# Patient Record
Sex: Male | Born: 1965 | Race: Black or African American | Hispanic: No | Smoking: Never smoker
Health system: Southern US, Community
[De-identification: ages and names within clinical notes are randomized; demographics above are authoritative.]

## PROBLEM LIST (undated history)

## (undated) DIAGNOSIS — I509 Heart failure, unspecified: Secondary | ICD-10-CM

## (undated) DIAGNOSIS — I10 Essential (primary) hypertension: Secondary | ICD-10-CM

## (undated) HISTORY — PX: MANDIBLE FRACTURE SURGERY: SHX706

---

## 2013-09-07 ENCOUNTER — Other Ambulatory Visit: Payer: Self-pay

## 2013-09-07 ENCOUNTER — Emergency Department (HOSPITAL_COMMUNITY): Payer: BC Managed Care – PPO

## 2013-09-07 ENCOUNTER — Encounter (HOSPITAL_COMMUNITY): Payer: Self-pay | Admitting: Emergency Medicine

## 2013-09-07 ENCOUNTER — Inpatient Hospital Stay (HOSPITAL_COMMUNITY)
Admission: EM | Admit: 2013-09-07 | Discharge: 2013-09-10 | DRG: 291 | Disposition: A | Discharge status: Home / Self Care | Payer: BC Managed Care – PPO | Attending: Internal Medicine | Admitting: Internal Medicine

## 2013-09-07 DIAGNOSIS — I959 Hypotension, unspecified: Secondary | ICD-10-CM | POA: Diagnosis present

## 2013-09-07 DIAGNOSIS — N289 Disorder of kidney and ureter, unspecified: Secondary | ICD-10-CM

## 2013-09-07 DIAGNOSIS — I5023 Acute on chronic systolic (congestive) heart failure: Principal | ICD-10-CM | POA: Diagnosis present

## 2013-09-07 DIAGNOSIS — J069 Acute upper respiratory infection, unspecified: Secondary | ICD-10-CM | POA: Diagnosis present

## 2013-09-07 DIAGNOSIS — I420 Dilated cardiomyopathy: Secondary | ICD-10-CM

## 2013-09-07 DIAGNOSIS — R0602 Shortness of breath: Secondary | ICD-10-CM

## 2013-09-07 DIAGNOSIS — I429 Cardiomyopathy, unspecified: Secondary | ICD-10-CM | POA: Diagnosis present

## 2013-09-07 DIAGNOSIS — I5021 Acute systolic (congestive) heart failure: Secondary | ICD-10-CM

## 2013-09-07 DIAGNOSIS — Z79899 Other long term (current) drug therapy: Secondary | ICD-10-CM

## 2013-09-07 DIAGNOSIS — I509 Heart failure, unspecified: Secondary | ICD-10-CM

## 2013-09-07 DIAGNOSIS — I5022 Chronic systolic (congestive) heart failure: Secondary | ICD-10-CM | POA: Diagnosis present

## 2013-09-07 DIAGNOSIS — E669 Obesity, unspecified: Secondary | ICD-10-CM | POA: Diagnosis present

## 2013-09-07 DIAGNOSIS — J189 Pneumonia, unspecified organism: Secondary | ICD-10-CM | POA: Diagnosis present

## 2013-09-07 DIAGNOSIS — N183 Chronic kidney disease, stage 3 unspecified: Secondary | ICD-10-CM | POA: Diagnosis present

## 2013-09-07 HISTORY — DX: Heart failure, unspecified: I50.9

## 2013-09-07 LAB — COMPREHENSIVE METABOLIC PANEL
AST: 34 U/L (ref 0–37)
Albumin: 3.6 g/dL (ref 3.5–5.2)
Alkaline Phosphatase: 72 U/L (ref 39–117)
Calcium: 9.2 mg/dL (ref 8.4–10.5)
Creatinine, Ser: 1.36 mg/dL — ABNORMAL HIGH (ref 0.50–1.35)
Glucose, Bld: 107 mg/dL — ABNORMAL HIGH (ref 70–99)
Total Protein: 6.6 g/dL (ref 6.0–8.3)

## 2013-09-07 LAB — CBC WITH DIFFERENTIAL/PLATELET
Basophils Absolute: 0 10*3/uL (ref 0.0–0.1)
Lymphs Abs: 1.5 10*3/uL (ref 0.7–4.0)
MCH: 28.3 pg (ref 26.0–34.0)
MCHC: 34.6 g/dL (ref 30.0–36.0)
MCV: 81.8 fL (ref 78.0–100.0)
Monocytes Absolute: 0.2 10*3/uL (ref 0.1–1.0)
Monocytes Relative: 5 % (ref 3–12)
Platelets: 287 10*3/uL (ref 150–400)
RDW: 13.3 % (ref 11.5–15.5)
WBC: 4.3 10*3/uL (ref 4.0–10.5)

## 2013-09-07 LAB — PRO B NATRIURETIC PEPTIDE: Pro B Natriuretic peptide (BNP): 3249 pg/mL — ABNORMAL HIGH (ref 0–125)

## 2013-09-07 LAB — MAGNESIUM: Magnesium: 1.9 mg/dL (ref 1.5–2.5)

## 2013-09-07 LAB — TSH: TSH: 0.74 u[IU]/mL (ref 0.350–4.500)

## 2013-09-07 MED ORDER — ENOXAPARIN SODIUM 40 MG/0.4ML ~~LOC~~ SOLN
40.0000 mg | SUBCUTANEOUS | Status: DC
  Administered 2013-09-07 – 2013-09-09 (×3): 40 mg via SUBCUTANEOUS
  Filled 2013-09-07 (×5): qty 0.4

## 2013-09-07 MED ORDER — FUROSEMIDE 10 MG/ML IJ SOLN
40.0000 mg | Freq: Two times a day (BID) | INTRAMUSCULAR | Status: DC
  Administered 2013-09-07 – 2013-09-10 (×6): 40 mg via INTRAVENOUS
  Filled 2013-09-07 (×9): qty 4

## 2013-09-07 MED ORDER — POTASSIUM CHLORIDE ER 10 MEQ PO TBCR
40.0000 meq | EXTENDED_RELEASE_TABLET | Freq: Two times a day (BID) | ORAL | Status: DC
  Administered 2013-09-07 – 2013-09-10 (×7): 40 meq via ORAL
  Filled 2013-09-07 (×8): qty 4

## 2013-09-07 MED ORDER — ACETAMINOPHEN 650 MG RE SUPP: 650.0000 mg | RECTAL | Status: DC | PRN

## 2013-09-07 MED ORDER — FUROSEMIDE 10 MG/ML IJ SOLN
40.0000 mg | Freq: Once | INTRAMUSCULAR | Status: AC
  Administered 2013-09-07: 40 mg via INTRAVENOUS
  Filled 2013-09-07: qty 4

## 2013-09-07 MED ORDER — IBUPROFEN 400 MG PO TABS
400.0000 mg | ORAL_TABLET | ORAL | Status: DC | PRN
  Administered 2013-09-08 – 2013-09-09 (×2): 400 mg via ORAL
  Filled 2013-09-07 (×2): qty 1

## 2013-09-07 MED ORDER — ACETAMINOPHEN 325 MG PO TABS: 650.0000 mg | ORAL_TABLET | ORAL | Status: DC | PRN

## 2013-09-07 MED ORDER — ACETAMINOPHEN 325 MG PO TABS
650.0000 mg | ORAL_TABLET | Freq: Once | ORAL | Status: AC
  Administered 2013-09-07: 650 mg via ORAL
  Filled 2013-09-07: qty 2

## 2013-09-07 MED ORDER — MAGNESIUM HYDROXIDE 400 MG/5ML PO SUSP: 30.0000 mL | Freq: Every day | ORAL | Status: DC | PRN

## 2013-09-07 MED ORDER — DIAZEPAM 5 MG PO TABS
5.0000 mg | ORAL_TABLET | Freq: Four times a day (QID) | ORAL | Status: DC | PRN
  Administered 2013-09-07: 5 mg via ORAL
  Filled 2013-09-07: qty 1

## 2013-09-07 MED ORDER — POTASSIUM CHLORIDE ER 10 MEQ PO TBCR: 10.0000 meq | EXTENDED_RELEASE_TABLET | Freq: Two times a day (BID) | ORAL | Status: DC

## 2013-09-07 NOTE — ED Notes (Signed)
The pt has had difficulty breathing for the past 2 weeks  Getting worse tonight.  He woke up just pta here and could not breathe

## 2013-09-07 NOTE — ED Provider Notes (Signed)
CSN: [id number]     Arrival date & time 09/07/13  8657 History   First MD Initiated Contact with Patient 09/07/13 0448     Chief Complaint  Patient presents with  . Shortness of Breath   (Consider location/radiation/quality/duration/timing/severity/associated sxs/prior Treatment) HPI Comments: 47 yo AA male with new onset SOB and bilat LE edema.  Pt doesn't see a doctor and has no PMH.    No tobacco, EtOH, or IDU.    Normal PO intake, void, and stool.      Patient is a 47 y.o. male presenting with shortness of breath. The history is provided by the patient.  Shortness of Breath Severity:  Mild Onset quality:  Gradual Duration:  2 weeks Timing:  Constant Progression:  Unchanged Chronicity:  Recurrent Context: activity and URI   Context: not animal exposure, not emotional upset, not fumes, not known allergens, not occupational exposure, not pollens, not smoke exposure, not strong odors and not weather changes   Context comment:  SOB with short distance walking Relieved by:  Nothing Worsened by:  Nothing tried Ineffective treatments:  None tried Associated symptoms: no chest pain, no claudication, no cough, no diaphoresis and no ear pain   Associated symptoms comment:  Leg edema Risk factors: obesity   Risk factors: no recent alcohol use, no family hx of DVT, no hx of cancer, no hx of PE/DVT, no oral contraceptive use, no prolonged immobilization, no recent surgery and no tobacco use     History reviewed. No pertinent past medical history. History reviewed. No pertinent past surgical history. No family history on file. History  Substance Use Topics  . Smoking status: Never Smoker   . Smokeless tobacco: Not on file  . Alcohol Use: No    Review of Systems  Constitutional: Positive for activity change. Negative for chills, diaphoresis and appetite change.  HENT: Negative for ear pain.   Eyes: Negative.   Respiratory: Positive for shortness of breath. Negative for cough.    Cardiovascular: Positive for leg swelling. Negative for chest pain, palpitations and claudication.  Gastrointestinal: Negative.   Genitourinary: Negative.   Allergic/Immunologic: Negative.   Neurological: Negative.   Hematological: Negative.   Psychiatric/Behavioral: Negative.     Allergies  Review of patient's allergies indicates no known allergies.  Home Medications   Current Outpatient Rx  Name  Route  Sig  Dispense  Refill  . Aspirin-Acetaminophen-Caffeine (GOODY HEADACHE PO)   Oral   Take 1 Package by mouth every 6 (six) hours as needed (headache).          BP 146/81  Pulse 96  Temp(Src) 97.6 F (36.4 C)  Resp 20  Ht 6' (1.829 m)  Wt 282 lb (127.914 kg)  BMI 38.24 kg/m2  SpO2 97% Physical Exam  Nursing note and vitals reviewed. Constitutional: He is oriented to person, place, and time. He appears well-developed and well-nourished. No distress.  HENT:  Head: Normocephalic and atraumatic.  Eyes: Conjunctivae and EOM are normal. Pupils are equal, round, and reactive to light.  Neck: Normal range of motion. Neck supple.  Cardiovascular: Normal rate and regular rhythm.  Exam reveals no friction rub.   No murmur heard. Pulmonary/Chest: Effort normal and breath sounds normal. No respiratory distress. He has no wheezes. He has no rales. He exhibits no tenderness.  Abdominal: Soft. Bowel sounds are normal. He exhibits no distension and no mass. There is no tenderness. There is no rebound and no guarding.  Musculoskeletal: He exhibits edema. He exhibits no tenderness.  Neurological: He is alert and oriented to person, place, and time. He has normal reflexes.  Skin: Skin is warm and dry. He is not diaphoretic.    ED Course  Procedures (including critical care time) Labs Review Results for orders placed during the hospital encounter of 09/07/13  CBC WITH DIFFERENTIAL      Result Value Range   WBC 4.3  4.0 - 10.5 K/uL   RBC 5.34  4.22 - 5.81 MIL/uL   Hemoglobin 15.1   13.0 - 17.0 g/dL   HCT 16.1  09.6 - 04.5 %   MCV 81.8  78.0 - 100.0 fL   MCH 28.3  26.0 - 34.0 pg   MCHC 34.6  30.0 - 36.0 g/dL   RDW 40.9  81.1 - 91.4 %   Platelets 287  150 - 400 K/uL   Neutrophils Relative % 52  43 - 77 %   Lymphocytes Relative 34  12 - 46 %   Monocytes Relative 5  3 - 12 %   Eosinophils Relative 8 (*) 0 - 5 %   Basophils Relative 1  0 - 1 %   Neutro Abs 2.3  1.7 - 7.7 K/uL   Lymphs Abs 1.5  0.7 - 4.0 K/uL   Monocytes Absolute 0.2  0.1 - 1.0 K/uL   Eosinophils Absolute 0.3  0.0 - 0.7 K/uL   Basophils Absolute 0.0  0.0 - 0.1 K/uL   Smear Review MORPHOLOGY UNREMARKABLE    COMPREHENSIVE METABOLIC PANEL      Result Value Range   Sodium 142  135 - 145 mEq/L   Potassium 4.2  3.5 - 5.1 mEq/L   Chloride 107  96 - 112 mEq/L   CO2 25  19 - 32 mEq/L   Glucose, Bld 107 (*) 70 - 99 mg/dL   BUN 15  6 - 23 mg/dL   Creatinine, Ser 7.82 (*) 0.50 - 1.35 mg/dL   Calcium 9.2  8.4 - 95.6 mg/dL   Total Protein 6.6  6.0 - 8.3 g/dL   Albumin 3.6  3.5 - 5.2 g/dL   AST 34  0 - 37 U/L   ALT 36  0 - 53 U/L   Alkaline Phosphatase 72  39 - 117 U/L   Total Bilirubin 0.5  0.3 - 1.2 mg/dL   GFR calc non Af Amer 60 (*) >90 mL/min   GFR calc Af Amer 69 (*) >90 mL/min  TROPONIN I      Result Value Range   Troponin I <0.30  <0.30 ng/mL  PRO B NATRIURETIC PEPTIDE      Result Value Range   Pro B Natriuretic peptide (BNP) 3249.0 (*) 0 - 125 pg/mL    Imaging Review Dg Chest 2 View  09/07/2013   CLINICAL DATA:  Shortness of breath for 10 days.  EXAM: CHEST  2 VIEW  COMPARISON:  None.  FINDINGS: The lungs are well-aerated. Patchy left-sided airspace opacity raises concern for pneumonia. Mild asymmetric pulmonary edema is considered less likely, though there is underlying vascular congestion. No pleural effusion or pneumothorax is seen.  The heart is enlarged.  No acute osseous abnormalities are seen.  IMPRESSION: Patchy left-sided airspace opacity raises concern for pneumonia. There is  underlying vascular congestion and cardiomegaly, and mild asymmetric pulmonary edema might have a similar appearance.   Electronically Signed   By: Roanna Raider M.D.   On: 09/07/2013 05:29     Date: 09/07/2013  Rate: 91  Rhythm: normal sinus rhythm  QRS  Axis: left  Intervals: normal and QT prolonged  ST/T Wave abnormalities: normal  Conduction Disutrbances:none  Narrative Interpretation:   Old EKG Reviewed: none available    MDM   1. CHF (congestive heart failure)   2. SOB (shortness of breath)    47 year old African American male presents emergency department with chief complaint of shortness of breath. Patient states his symptoms have been increasing over the past 2-3 weeks. He reports dyspnea on exertion which is new. He also has bilateral lower extremity edema. Patient denies chest pain.  EKG without signs of ischemia however patient does have a left axis deviation and frequent PVCs noted. Chest x-ray interpreted as possible pneumonia versus asymmetric pulmonary edema. Clinically the patient does not present as a pneumonia. He does not have a productive cough, fever, and his white cell count is normal. I will not treat with antibiotics at this time.  Case discussed with hospitalist plan for admission for new onset CHF. Will give dose of Lasix in the emergency department.  The patient appears reasonably stabilized for admission considering the current resources, flow, and capabilities available in the ED at this time, and I doubt any other [facility] requiring further screening and/or treatment in the ED prior to admission.    Darlys Gales, MD 09/07/13 [phone]

## 2013-09-07 NOTE — H&P (Signed)
[facility] History and Physical  Noah Floyd EXB:[id number] DOB: 1965/10/22 DOA: 09/07/2013  Referring physician:  PCP: No PCP Per Patient   Chief Complaint: shortness of breath  HPI: Noah Floyd is a 47 y.o. male with no PMH presents with several weeks of worsening DOE. Leg edema.  Weight gain, PND.  Had a cold prior to onset, but no cough, F/C currently.  No h/o same. In ed, had CXR showing asymmetric pulmonary edema and pBNP >3000.  Received lasix, and slightly less SOB.   Review of Systems:  Systems reviewed. As above. Otherwise negative  History reviewed. No pertinent past medical history. Past Surgical History  Procedure Laterality Date  . Mandible fracture surgery     Social History: married. No h/o smoking. Previous EtOH, none for > 10 years. Denies EtOH. Married  No Known Allergies  Family History  Problem Relation Age of Onset  . Heart disease Mother   . Diabetes Mother      Prior to Admission medications   Medication Sig Start Date End Date Taking? Authorizing Provider  Aspirin-Acetaminophen-Caffeine (GOODY HEADACHE PO) Take 1 Package by mouth every 6 (six) hours as needed (headache).   Yes Historical Provider, MD   Physical Exam: Filed Vitals:   09/07/13 0723  BP: 143/82  Pulse: 93  Temp:   Resp: 18    BP 143/82  Pulse 93  Temp(Src) 97.6 F (36.4 C)  Resp 18  Ht 6' (1.829 m)  Wt 127.914 kg (282 lb)  BMI 38.24 kg/m2  SpO2 97%  BP 143/90  Pulse 94  Temp(Src) 98.2 F (36.8 C) (Oral)  Resp 18  Ht 6' (1.829 m)  Wt 127.914 kg (282 lb)  BMI 38.24 kg/m2  SpO2 97%  General Appearance:    Alert, cooperative, no distress, appears stated age  Head:    Normocephalic, without obvious abnormality, atraumatic  Eyes:    PERRL, conjunctiva/corneas clear, EOM's intact, fundi    benign, both eyes          Nose:   Nares normal, septum midline, mucosa normal, no drainage   or sinus tenderness  Throat:   Lips, mucosa, and tongue normal; teeth and gums  normal  Neck:   Supple. JVD present. No thyromegaly, lymphadenopathy  Back:     Symmetric, no curvature, ROM normal, no CVA tenderness  Lungs:     Clear to auscultation bilaterally, respirations unlabored  Chest wall:    No tenderness or deformity  Heart:    Regular rate and rhythm, S1 and S2 normal, no murmur, rub   or gallop  Abdomen:     Soft, non-tender, bowel sounds active all four quadrants,    no masses, no organomegaly  Genitalia:  Deferred   Rectal:  Deferred  Extremities:   Extremities normal, atraumatic, no cyanosis 1+ edema  Pulses:   2+ and symmetric all extremities  Skin:   Skin color, texture, turgor normal, no rashes or lesions  Lymph nodes:   Cervical, supraclavicular, and axillary nodes normal  Neurologic:   CNII-XII intact. Normal strength, sensation and reflexes      throughout                    Psych: normal affect  Labs on Admission:  Basic Metabolic Panel:  Recent Labs Lab 09/07/13 0420  NA 142  K 4.2  CL 107  CO2 25  GLUCOSE 107*  BUN 15  CREATININE 1.36*  CALCIUM 9.2   Liver Function Tests:  Recent  Labs Lab 09/07/13 0420  AST 34  ALT 36  ALKPHOS 72  BILITOT 0.5  PROT 6.6  ALBUMIN 3.6   No results found for this basename: LIPASE, AMYLASE,  in the last 168 hours No results found for this basename: AMMONIA,  in the last 168 hours CBC:  Recent Labs Lab 09/07/13 0420  WBC 4.3  NEUTROABS 2.3  HGB 15.1  HCT 43.7  MCV 81.8  PLT 287   Cardiac Enzymes:  Recent Labs Lab 09/07/13 0420  TROPONINI <0.30    BNP (last 3 results)  Recent Labs  09/07/13 0420  PROBNP 3249.0*   CBG: No results found for this basename: GLUCAP,  in the last 168 hours  Radiological Exams on Admission: Dg Chest 2 View  09/07/2013   CLINICAL DATA:  Shortness of breath for 10 days.  EXAM: CHEST  2 VIEW  COMPARISON:  None.  FINDINGS: The lungs are well-aerated. Patchy left-sided airspace opacity raises concern for pneumonia. Mild asymmetric pulmonary  edema is considered less likely, though there is underlying vascular congestion. No pleural effusion or pneumothorax is seen.  The heart is enlarged.  No acute osseous abnormalities are seen.  IMPRESSION: Patchy left-sided airspace opacity raises concern for pneumonia. There is underlying vascular congestion and cardiomegaly, and mild asymmetric pulmonary edema might have a similar appearance.   Electronically Signed   By: Roanna Raider M.D.   On: 09/07/2013 05:29    EKG: ISinus rhythm with frequent Premature ventricular complexes Left axis deviation Prolonged QT Abnormal ECG  Assessment/Plan Principal Problem:   CHF (congestive heart failure): lasix. Echo. TSH. Low salt diet and teaching. tele Active Problems:   Obesity, unspecified   Renal insufficiency: baseline unknown. monitor   Code Status: *full Family Communication: family at bedside Disposition Plan: home  Time spent: 60 min  Emmarie Sannes L [facility] Pager [phone]

## 2013-09-07 NOTE — ED Notes (Signed)
The pt is c/o intermittent chest tightness

## 2013-09-07 NOTE — ED Notes (Signed)
Charge nurse from [facility] will call back for report.

## 2013-09-07 NOTE — ED Notes (Signed)
The pt feels like he has a lump in his throat that has been there since he had a cold 2 weeks ago

## 2013-09-07 NOTE — Progress Notes (Signed)
Pt continues to c/o of cramps. Initially in his abdomen, then r ight side, btn his thighs and legs. Latest at this time is onhis legs. Will continue to monitor and refer accordingly.

## 2013-09-08 ENCOUNTER — Encounter (HOSPITAL_COMMUNITY): Payer: Self-pay | Admitting: Cardiology

## 2013-09-08 DIAGNOSIS — I428 Other cardiomyopathies: Secondary | ICD-10-CM

## 2013-09-08 DIAGNOSIS — I517 Cardiomegaly: Secondary | ICD-10-CM

## 2013-09-08 DIAGNOSIS — E669 Obesity, unspecified: Secondary | ICD-10-CM

## 2013-09-08 DIAGNOSIS — R0602 Shortness of breath: Secondary | ICD-10-CM

## 2013-09-08 DIAGNOSIS — I5021 Acute systolic (congestive) heart failure: Secondary | ICD-10-CM

## 2013-09-08 DIAGNOSIS — N289 Disorder of kidney and ureter, unspecified: Secondary | ICD-10-CM

## 2013-09-08 LAB — BASIC METABOLIC PANEL
BUN: 14 mg/dL (ref 6–23)
CO2: 25 mEq/L (ref 19–32)
Calcium: 9.1 mg/dL (ref 8.4–10.5)
Creatinine, Ser: 1.25 mg/dL (ref 0.50–1.35)
GFR calc Af Amer: 77 mL/min — ABNORMAL LOW (ref >90)
GFR calc non Af Amer: 66 mL/min — ABNORMAL LOW (ref >90)

## 2013-09-08 MED ORDER — LISINOPRIL 5 MG PO TABS
5.0000 mg | ORAL_TABLET | Freq: Every day | ORAL | Status: DC
  Administered 2013-09-08: 5 mg via ORAL
  Filled 2013-09-08 (×2): qty 1

## 2013-09-08 MED ORDER — LEVOFLOXACIN 750 MG PO TABS
750.0000 mg | ORAL_TABLET | Freq: Every day | ORAL | Status: DC
  Administered 2013-09-08: 750 mg via ORAL
  Filled 2013-09-08 (×3): qty 1

## 2013-09-08 NOTE — Care Management Note (Signed)
    Page 1 of 1   09/10/2013     10:04:18 AM   CARE MANAGEMENT NOTE 09/10/2013  Patient:  Noah Floyd,Noah Floyd   Account Number:  [id number]  Date Initiated:  09/08/2013  Documentation initiated by:  GRAVES-BIGELOW,Annaliese Saez  Subjective/Objective Assessment:   Pt admitted with SOB.     Action/Plan:   CM will continue to monitor for disposition needs.   Anticipated DC Date:  09/10/2013   Anticipated DC Plan:  HOME/SELF CARE      DC Planning Services  CM consult  PCP issues      Choice offered to / List presented to:             Status of service:  Completed, signed off Medicare Important Message given?   (If response is "NO", the following Medicare IM given date fields will be blank) Date Medicare IM given:   Date Additional Medicare IM given:    Discharge Disposition:  HOME/SELF CARE  Per UR Regulation:  Reviewed for med. necessity/level of care/duration of stay  If discussed at Long Length of Stay Meetings, dates discussed:    Comments:  09-10-13 [address] [place] [phone] CM did provide pt with the [facility] #. Pt was advised to also contact the customer service # on his BCBS card to find a PCP that accepts his insurance in this area. No further needs from CM at this time.

## 2013-09-08 NOTE — Progress Notes (Signed)
UR Completed Makaylynn Bonillas Graves-Bigelow, RN,BSN [phone]  

## 2013-09-08 NOTE — Progress Notes (Signed)
  Echocardiogram 2D Echocardiogram has been performed.  Noah Floyd 09/08/2013, 12:44 PM

## 2013-09-08 NOTE — Plan of Care (Signed)
Problem: Food- and Nutrition-Related Knowledge Deficit (NB-1.1) Goal: Nutrition education Formal process to instruct or train a patient/client in a skill or to impart knowledge to help patients/clients voluntarily manage or modify food choices and eating behavior to maintain or improve health. Outcome: Completed/Met Date Met:  09/08/13 Nutrition Education Note  RD consulted for nutrition education regarding new onset CHF.  RD provided "Low Sodium Nutrition Therapy" handout from the Academy of [employer]etics. Reviewed patient's dietary recall. Provided examples on ways to decrease sodium intake in diet. Discouraged intake of processed foods and use of salt shaker. Encouraged fresh fruits and vegetables as well as whole grain sources of carbohydrates to maximize fiber intake.   RD discussed why it is important for patient to adhere to diet recommendations, and emphasized the role of fluids, foods to avoid, and importance of weighing self daily. Teach back method used.  Expect fair to good compliance.  Body mass index is 36.37 kg/(m^2). Pt meets criteria for obesity, class 2 based on current BMI.  Current diet order is low sodium, patient is consuming approximately 100% of meals at this time. Labs and medications reviewed. No further nutrition interventions warranted at this time. RD contact information provided. If additional nutrition issues arise, please re-consult RD.    Joaquin Courts, RD, LDN, CNSC Pager [phone] After Hours Pager [phone]

## 2013-09-08 NOTE — Consult Note (Addendum)
Admit date: 09/07/2013 Referring Physician  Dr. Carmell Austria Primary Physician No PCP Per Patient Primary Cardiologist  none Reason for Consultation  EF 20-25%, cardiomyopathy, new onset heart failure  HPI: 47 year old male who presented with dyspnea cough, increase in weight over the past 2-4 weeks after a presumed upper respiratory viral infection with a BNP of 3200 who just underwent echocardiogram on 09/08/13 which showed diffuse hypokinesis of the left ventricle with systolic function of 20-25% EF. No prior history of cardiomyopathy or heart disease. He also does not have a prior history of hypertension. TSH was normal. No alcohol use. Nonsmoker. Mother did have dilated heart he states. Troponin was normal. 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  He has responded to Lasix IV 40 mg twice a day with output of approximately 2 L. Weight on admission was 282, weight on 12/14 was 268. He is in normal rhythm. EKG on 09/07/13 showed sinus rhythm with ventricular bigeminy, left axis deviation, nonspecific ST-T wave changes, borderline prolonged QT.  Currently he does feel much improved after aggressive diuresis. He just started a new job and he is very worried about missing these days in the hospital.   PMH:  History reviewed. No pertinent past medical history.  PSH:   Past Surgical History  Procedure Laterality Date  . Mandible fracture surgery     Allergies:  Review of patient's allergies indicates no known allergies. Prior to Admit Meds:   Prior to Admission medications   Medication Sig Start Date End Date Taking? Authorizing Provider  Aspirin-Acetaminophen-Caffeine (GOODY HEADACHE PO) Take 1 Package by mouth every 6 (six) hours as needed (headache).   Yes Historical Provider, MD   Fam HX:    Family History  Problem Relation Age of Onset  . Heart disease Mother   . Diabetes Mother    Social HX:     Enjoys working out with a Geologist, engineering. History   Social History  . Marital Status: Married    Spouse Name: N/A    Number of Children: N/A  . Years of Education: N/A   Occupational History  . Not on file.   Social History Main Topics  . Smoking status: Never Smoker   . Smokeless tobacco: Never Used  . Alcohol Use: No  . Drug Use: Not on file  . Sexual Activity: Not on file   Other Topics Concern  . Not on file   Social History Narrative  . No narrative on file     ROS:  Denies any strokelike symptoms, rashes, fevers, abnormal joint discomfort, bleeding, syncope. He does have mild dizziness when he bends over to tie his shoe which is not abnormal for him. No chest pain or anginal symptoms. All 11 ROS were addressed and are negative except what is stated in the HPI   Physical Exam: Blood pressure 110/78, pulse 92, temperature 98 F (36.7 C), temperature source Oral, resp. rate 20, height 6' (1.829 m), weight 268 lb 3.2 oz (121.655 kg), SpO2 96.00%.   General: Well developed, well nourished, in no acute distress, lifts weights Head: Eyes PERRLA, No xanthomas.   Normal cephalic and atramatic  Lungs:   Clear bilaterally to auscultation and percussion. Normal respiratory effort. No wheezes, no rales. Heart:   HRRR S1 S2 Pulses are 2+ & equal. No murmur, rubs. Soft S3 gallop.  No carotid bruit. Mild JVD.  No abdominal bruits.  Abdomen: Bowel sounds are positive, abdomen soft and  non-tender without masses. No hepatosplenomegaly. Mildly protuberant Msk:  Back normal. Normal strength and tone for age. Extremities:  No clubbing, cyanosis or edema.  DP +1 Neuro: Alert and oriented X 3, non-focal, MAE x 4 GU: Deferred Rectal: Deferred Psych:  Good affect, responds appropriately      Labs: Lab Results  Component Value Date   WBC 4.3 09/07/2013   HGB 15.1 09/07/2013   HCT 43.7 09/07/2013   MCV 81.8 09/07/2013   PLT 287 09/07/2013     Recent Labs Lab 09/07/13 0420  09/08/13 0515  NA 142  --  142  K 4.2   < > 4.3  CL 107  --  104  CO2 25  --  25  BUN 15  --  14  CREATININE 1.36*  --  1.25  CALCIUM 9.2  --  9.1  PROT 6.6  --   --   BILITOT 0.5  --   --   ALKPHOS 72  --   --   ALT 36  --   --   AST 34  --   --   GLUCOSE 107*  --  105*  < > = values in this interval not displayed.  Recent Labs  09/07/13 0420  TROPONINI <0.30     Radiology:  Dg Chest 2 View  09/07/2013   CLINICAL DATA:  Shortness of breath for 10 days.  EXAM: CHEST  2 VIEW  COMPARISON:  None.  FINDINGS: The lungs are well-aerated. Patchy left-sided airspace opacity raises concern for pneumonia. Mild asymmetric pulmonary edema is considered less likely, though there is underlying vascular congestion. No pleural effusion or pneumothorax is seen.  The heart is enlarged.  No acute osseous abnormalities are seen.  IMPRESSION: Patchy left-sided airspace opacity raises concern for pneumonia. There is underlying vascular congestion and cardiomegaly, and mild asymmetric pulmonary edema might have a similar appearance.   Electronically Signed   By: Roanna Raider M.D.   On: 09/07/2013 05:29   Personally viewed.  EKG:    EKG on 09/07/13 showed sinus rhythm with ventricular bigeminy, left axis deviation, nonspecific ST-T wave changes, borderline prolonged QT.  Telemetry shows no evidence of ventricular tachycardia. Normal sinus rhythm. Personally viewed.   ECHO: 09/08/13 - Left ventricle: The cavity size was moderately dilated. Systolic function was severely reduced. The estimated ejection fraction was in the range of 20% to 25%. Diffuse hypokinesis. Features are consistent with a pseudonormal left ventricular filling pattern, with concomitant abnormal relaxation without increased filling pressure (grade 2 diastolic dysfunction). - Left atrium: The atrium was severely dilated. - Right ventricle: The cavity size was mildly dilated. Wall thickness was normal.    ASSESSMENT/PLAN:    47 year old male with progressive  worsening shortness of breath consistent with newly discovered acute systolic heart failure, cardiomyopathy.  1. Acute systolic heart failure-ejection fraction 20-25%. I agree with use of IV Lasix for decongestion. Continue with current protocol of 40 mg IV twice a day. Overall excellent urine output. Creatinine has decreased from 1.36 down to 1.25. I will add low-dose ACE inhibitor, lisinopril 5 mg once a day for further afterload reduction. He remains in sinus rhythm. TSH is normal. No alcohol. No anginal symptoms. Mother had heart failure. 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 Other possibilities still include ischemic. At some point, if ejection fraction does not improve, will need cardiac catheterization to exclude coronary artery disease.  He is quite adamant about leaving the hospital tomorrow because of his  new job. We will see how he is in the morning. If he does insist upon leaving, we will need to establish very close followup in the outpatient setting.  2. Obesity-continue to encourage weight loss. Large muscle mass as well.  3. Chronic kidney disease stage III-creatinine has improved after diuresis. Likely secondary to low cardiac output.  Donato Schultz, MD  09/08/2013  3:19 PM

## 2013-09-08 NOTE — Progress Notes (Signed)
[facility] PROGRESS NOTE  Noah Floyd WUJ:[id number] DOB: 1965/12/28 DOA: 09/07/2013 PCP: No PCP Per Patient  Assessment/Plan: 1-CHF (congestive heart failure); patient presents with dyspnea, cough, chest x ray with asymmetric pulmonary edema, cardiomegaly. BNP 3249. ECHO pending. Continue with lasix. Weight; 127---121.  Renal insufficiency: cr decrease. Improved.   PNA; Will start with levaquin. Suspect 2 weeks ago he had viral infection.    Code Status: Full Code.  Family Communication: Care discussed with patient.  Disposition Plan: Remain inpatient.    Consultants:  none  Procedures:  ECHO pending.   Antibiotics:  Levaquin 12-15  HPI/Subjective: Feeling better. Was having cramps all night. Lower extremity and abdomen.    Objective: Filed Vitals:   09/08/13 0500  BP: 110/78  Pulse: 90  Temp: 98.8 F (37.1 C)  Resp: 18    Intake/Output Summary (Last 24 hours) at 09/08/13 0943 Last data filed at 09/08/13 0500  Gross per 24 hour  Intake    600 ml  Output   1280 ml  Net   -680 ml   Filed Weights   09/07/13 0401 09/08/13 0500  Weight: 127.914 kg (282 lb) 121.655 kg (268 lb 3.2 oz)    Exam:   General:  No distress.   Cardiovascular: S 1, S 2 RRR  Respiratory: CTA  Abdomen: BS present, soft, NT, obese.   Musculoskeletal: trace edema.   Data Reviewed: Basic Metabolic Panel:  Recent Labs Lab 09/07/13 0420 09/07/13 1015 09/07/13 1345 09/08/13 0515  NA 142  --   --  142  K 4.2  --  4.5 4.3  CL 107  --   --  104  CO2 25  --   --  25  GLUCOSE 107*  --   --  105*  BUN 15  --   --  14  CREATININE 1.36*  --   --  1.25  CALCIUM 9.2  --   --  9.1  MG  --  1.9  --   --    Liver Function Tests:  Recent Labs Lab 09/07/13 0420  AST 34  ALT 36  ALKPHOS 72  BILITOT 0.5  PROT 6.6  ALBUMIN 3.6   No results found for this basename: LIPASE, AMYLASE,  in the last 168 hours No results found for this basename: AMMONIA,  in the last 168  hours CBC:  Recent Labs Lab 09/07/13 0420  WBC 4.3  NEUTROABS 2.3  HGB 15.1  HCT 43.7  MCV 81.8  PLT 287   Cardiac Enzymes:  Recent Labs Lab 09/07/13 0420  TROPONINI <0.30   BNP (last 3 results)  Recent Labs  09/07/13 0420  PROBNP 3249.0*   CBG: No results found for this basename: GLUCAP,  in the last 168 hours  No results found for this or any previous visit (from the past 240 hour(s)).   Studies: Dg Chest 2 View  09/07/2013   CLINICAL DATA:  Shortness of breath for 10 days.  EXAM: CHEST  2 VIEW  COMPARISON:  None.  FINDINGS: The lungs are well-aerated. Patchy left-sided airspace opacity raises concern for pneumonia. Mild asymmetric pulmonary edema is considered less likely, though there is underlying vascular congestion. No pleural effusion or pneumothorax is seen.  The heart is enlarged.  No acute osseous abnormalities are seen.  IMPRESSION: Patchy left-sided airspace opacity raises concern for pneumonia. There is underlying vascular congestion and cardiomegaly, and mild asymmetric pulmonary edema might have a similar appearance.   Electronically Signed   By: Leotis Shames  Chang M.D.   On: 09/07/2013 05:29    Scheduled Meds: . enoxaparin (LOVENOX) injection  40 mg Subcutaneous Q24H  . furosemide  40 mg Intravenous BID  . levofloxacin  750 mg Oral Daily  . potassium chloride  40 mEq Oral BID   Continuous Infusions:   Principal Problem:   CHF (congestive heart failure) Active Problems:   Obesity, unspecified   Renal insufficiency    Time spent: 35 minutes.     Sowmya Partridge  [facility] Pager [phone]. If 7PM-7AM, please contact night-coverage at [email or web address], password [facility] 09/08/2013, 9:43 AM  LOS: 1 day

## 2013-09-09 ENCOUNTER — Inpatient Hospital Stay (HOSPITAL_COMMUNITY): Payer: BC Managed Care – PPO

## 2013-09-09 DIAGNOSIS — N183 Chronic kidney disease, stage 3 unspecified: Secondary | ICD-10-CM

## 2013-09-09 LAB — BASIC METABOLIC PANEL
BUN: 15 mg/dL (ref 6–23)
Creatinine, Ser: 1.25 mg/dL (ref 0.50–1.35)
GFR calc Af Amer: 77 mL/min — ABNORMAL LOW (ref >90)
GFR calc non Af Amer: 66 mL/min — ABNORMAL LOW (ref >90)
Sodium: 138 mEq/L (ref 135–145)

## 2013-09-09 LAB — CBC
HCT: 45.4 % (ref 39.0–52.0)
MCHC: 34.4 g/dL (ref 30.0–36.0)
RDW: 13.4 % (ref 11.5–15.5)

## 2013-09-09 MED ORDER — LISINOPRIL 2.5 MG PO TABS
2.5000 mg | ORAL_TABLET | Freq: Every day | ORAL | Status: DC
  Filled 2013-09-09 (×2): qty 1

## 2013-09-09 NOTE — Progress Notes (Signed)
Subjective:  Had feeling of "bubble in chest" last night. Felt SOB transiently. Now little better. OK with staying today. 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 Worried about this.   Objective:  Vital Signs in the last 24 hours: Temp:  [98 F (36.7 C)-98.3 F (36.8 C)] 98.3 F (36.8 C) (12/16 0559) Pulse Rate:  [86-93] 86 (12/16 0659) Resp:  [18-20] 20 (12/16 0659) BP: (95-110)/(32-79) 95/77 mmHg (12/16 0659) SpO2:  [96 %-100 %] 100 % (12/16 0659) Weight:  [264 lb 3.2 oz (119.84 kg)] 264 lb 3.2 oz (119.84 kg) (12/16 0559)  Intake/Output from previous day: 12/15 0701 - 12/16 0700 In: 360 [P.O.:360] Out: 1425 [Urine:1425]   Physical Exam: General: Well developed, well nourished, in no acute distress. Head:  Normocephalic and atraumatic. Lungs: Clear to auscultation and percussion. Heart: Normal S1 and S2. Occasional ectopy.  No murmur, rubs or gallops.  Abdomen: soft, non-tender, positive bowel sounds. Extremities: No clubbing or cyanosis. No edema. Neurologic: Alert and oriented x 3.    Lab Results:  Recent Labs  09/07/13 0420 09/09/13 0600  WBC 4.3 4.1  HGB 15.1 15.6  PLT 287 297    Recent Labs  09/07/13 0420 09/07/13 1345 09/08/13 0515  NA 142  --  142  K 4.2 4.5 4.3  CL 107  --  104  CO2 25  --  25  GLUCOSE 107*  --  105*  BUN 15  --  14  CREATININE 1.36*  --  1.25    Recent Labs  09/07/13 0420  TROPONINI <0.30   Hepatic Function Panel  Recent Labs  09/07/13 0420  PROT 6.6  ALBUMIN 3.6  AST 34  ALT 36  ALKPHOS 72  BILITOT 0.5   No results found for this basename: CHOL,  in the last 72 hours No results found for this basename: PROTIME,  in the last 72 hours  Imaging: Dg Chest 2 View  09/09/2013   CLINICAL DATA:  Shortness of breath.  EXAM: CHEST  2 VIEW  COMPARISON:  September 07, 2013.  FINDINGS: The heart size and mediastinal contours are within normal limits. Both lungs are clear. No pleural effusion or pneumothorax is  noted. Ossification of anterior longitudinal ligament is noted.  IMPRESSION: No active cardiopulmonary disease.   Electronically Signed   By: Roque Lias M.D.   On: 09/09/2013 08:27   Personally viewed.   Telemetry: NSR PVC's Personally viewed.   Cardiac Studies:  ECHO 09/08/13:   - Left ventricle: The cavity size was moderately dilated. Systolic function was severely reduced. The estimated ejection fraction was in the range of 20% to 25%. Diffuse hypokinesis. Features are consistent with a pseudonormal left ventricular filling pattern, with concomitant abnormal relaxation without increased filling pressure (grade 2 diastolic dysfunction). - Left atrium: The atrium was severely dilated. - Right ventricle: The cavity size was mildly dilated. Wall thickness was normal.   Assessment/Plan:  Principal Problem:   Acute systolic heart failure Active Problems:   Obesity, unspecified   Renal insufficiency  1) Acute systolic heart failure with newly diagnosed cardiomyopathy   - Continue with IV lasix today  - Tried to add lisinopril 5mg  but BP too low. Will cut back to 2.5mg .   - Tomorrow will try to add low dose beta blocker.   - Weight down from 282 to 264? Validity.    - Creat stable.   - Possible viral cardiomyopathy with recent respiratory illness.   2) CKD -  3 - montior stable  3) Obesity - weight loss  4) Hypotension - will likely have difficulty in titrating medication.   Noah Floyd 09/09/2013, 8:38 AM

## 2013-09-09 NOTE — Progress Notes (Signed)
[facility] PROGRESS NOTE  Noah Floyd ZOX:[id number] DOB: [birth year] DOA: 09/07/2013 PCP: No PCP Per Patient  Assessment/Plan: 47 year old with no significant PMH who presents with dyspnea on exertion, history of viral infection 2 weeks ago. He was found to have EF 20 %.   1-Acute Systolic CHF (congestive heart failure) exacerbation; patient presents with dyspnea, cough, chest x ray on admission  with asymmetric pulmonary edema, cardiomegaly. BNP 3249. ECHO show EF 20 %. Continue with lasix 40 mg IV BID.  Appreciate Dr Anne Fu evaluation and recommendation.  ACE decrease to 2.5 to avoid hypotension.  Weight; 127---121--119 Kg.  Negative 3 L.  Repeated Chest X ray 12-16  with no active cardiopulmonary diseases.  HIV negative.   2-Renal insufficiency:  Cr peak to 1.3. Renal function stable on current dose of lasix.   3-PNA; Continue with Levaquin day 2/5. Suspect 2 weeks ago he had viral infection.    Code Status: Full Code.  Family Communication: Care discussed with patient.  Disposition Plan: Remain inpatient.    Consultants:  none  Procedures: ECHO:  Left ventricle: The cavity size was moderately dilated. Systolic function was severely reduced. The estimated ejection fraction was in the range of 20% to 25%. Diffuse hypokinesis. Features are consistent with a pseudonormal left ventricular filling pattern, with concomitant abnormal relaxation without increased filling pressure (grade 2 diastolic dysfunction). - Left atrium: The atrium was severely dilated. - Right ventricle: The cavity size was mildly dilated. Wall thickness was normal.    Antibiotics:  Levaquin 12-15  HPI/Subjective: Had an episode of SOB and gas sensation  this morning while lying down in bed.  Feeling better now.  Cramps legs better.    Objective: Filed Vitals:   09/09/13 0659  BP: 95/77  Pulse: 86  Temp:   Resp: 20    Intake/Output Summary (Last 24 hours) at 09/09/13 1026 Last  data filed at 09/09/13 0721  Gross per 24 hour  Intake    360 ml  Output   1525 ml  Net  -1165 ml   Filed Weights   09/07/13 0401 09/08/13 0500 09/09/13 0559  Weight: 127.914 kg (282 lb) 121.655 kg (268 lb 3.2 oz) 119.84 kg (264 lb 3.2 oz)    Exam:   General:  No distress.   Cardiovascular: S 1, S 2 RRR  Respiratory: CTA  Abdomen: BS present, soft, NT, obese.   Musculoskeletal: trace edema.   Data Reviewed: Basic Metabolic Panel:  Recent Labs Lab 09/07/13 0420 09/07/13 1015 09/07/13 1345 09/08/13 0515 09/09/13 0600  NA 142  --   --  142 138  K 4.2  --  4.5 4.3 4.7  CL 107  --   --  104 103  CO2 25  --   --  25 22  GLUCOSE 107*  --   --  105* 108*  BUN 15  --   --  14 15  CREATININE 1.36*  --   --  1.25 1.25  CALCIUM 9.2  --   --  9.1 9.0  MG  --  1.9  --   --   --    Liver Function Tests:  Recent Labs Lab 09/07/13 0420  AST 34  ALT 36  ALKPHOS 72  BILITOT 0.5  PROT 6.6  ALBUMIN 3.6   No results found for this basename: LIPASE, AMYLASE,  in the last 168 hours No results found for this basename: AMMONIA,  in the last 168 hours CBC:  Recent Labs Lab  09/07/13 0420 09/09/13 0600  WBC 4.3 4.1  NEUTROABS 2.3  --   HGB 15.1 15.6  HCT 43.7 45.4  MCV 81.8 82.1  PLT 287 297   Cardiac Enzymes:  Recent Labs Lab 09/07/13 0420  TROPONINI <0.30   BNP (last 3 results)  Recent Labs  09/07/13 0420  PROBNP 3249.0*   CBG: No results found for this basename: GLUCAP,  in the last 168 hours  No results found for this or any previous visit (from the past 240 hour(s)).   Studies: Dg Chest 2 View  09/09/2013   CLINICAL DATA:  Shortness of breath.  EXAM: CHEST  2 VIEW  COMPARISON:  September 07, 2013.  FINDINGS: The heart size and mediastinal contours are within normal limits. Both lungs are clear. No pleural effusion or pneumothorax is noted. Ossification of anterior longitudinal ligament is noted.  IMPRESSION: No active cardiopulmonary disease.    Electronically Signed   By: Roque Lias M.D.   On: 09/09/2013 08:27    Scheduled Meds: . enoxaparin (LOVENOX) injection  40 mg Subcutaneous Q24H  . furosemide  40 mg Intravenous BID  . levofloxacin  750 mg Oral Daily  . lisinopril  2.5 mg Oral Daily  . potassium chloride  40 mEq Oral BID   Continuous Infusions:   Principal Problem:   Acute systolic heart failure Active Problems:   Obesity, unspecified   Renal insufficiency    Time spent: 30 minutes.     Kyllie Pettijohn  [facility] Pager [phone]. If 7PM-7AM, please contact night-coverage at [email or web address], password [facility] 09/09/2013, 10:26 AM  LOS: 2 days

## 2013-09-09 NOTE — Progress Notes (Signed)
MD informed of pt c/o of SOB while laying down. VS stable & in he chart. Pt states feels like he has an air bubble just sitting in his chest. New orders given for ekg & cxr. Will continue to monitor the pt. Sanda Linger, RN

## 2013-09-10 LAB — BASIC METABOLIC PANEL
BUN: 18 mg/dL (ref 6–23)
Calcium: 9.3 mg/dL (ref 8.4–10.5)
GFR calc Af Amer: 74 mL/min — ABNORMAL LOW (ref >90)
GFR calc non Af Amer: 64 mL/min — ABNORMAL LOW (ref >90)
Potassium: 4.6 mEq/L (ref 3.5–5.1)
Sodium: 138 mEq/L (ref 135–145)

## 2013-09-10 MED ORDER — METOPROLOL SUCCINATE ER 25 MG PO TB24
25.0000 mg | ORAL_TABLET | Freq: Every day | ORAL | Status: DC
  Filled 2013-09-10: qty 1

## 2013-09-10 MED ORDER — FUROSEMIDE 40 MG PO TABS: 40.0000 mg | ORAL_TABLET | Freq: Every day | ORAL | Status: DC

## 2013-09-10 MED ORDER — FUROSEMIDE 40 MG PO TABS
40.0000 mg | ORAL_TABLET | Freq: Every day | ORAL | Status: DC
  Filled 2013-09-10: qty 1

## 2013-09-10 MED ORDER — POTASSIUM CHLORIDE ER 20 MEQ PO TBCR: 40.0000 meq | EXTENDED_RELEASE_TABLET | Freq: Every day | ORAL | Status: DC

## 2013-09-10 MED ORDER — METOPROLOL SUCCINATE ER 25 MG PO TB24: 25.0000 mg | ORAL_TABLET | Freq: Every day | ORAL | Status: DC

## 2013-09-10 MED ORDER — LISINOPRIL 2.5 MG PO TABS: 2.5000 mg | ORAL_TABLET | Freq: Every day | ORAL | Status: DC

## 2013-09-10 MED ORDER — LEVOFLOXACIN 750 MG PO TABS: 750.0000 mg | ORAL_TABLET | Freq: Every day | ORAL | Status: DC

## 2013-09-10 NOTE — Progress Notes (Signed)
    Subjective:  Feels better. No SOB. 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 Worried about this.   Objective:  Vital Signs in the last 24 hours: Temp:  [98.1 F (36.7 C)-98.9 F (37.2 C)] 98.9 F (37.2 C) (12/17 0500) Pulse Rate:  [60-93] 93 (12/17 0500) Resp:  [18-20] 20 (12/17 0500) BP: (110-146)/(61-78) 118/61 mmHg (12/17 0500) SpO2:  [94 %-98 %] 94 % (12/17 0500) Weight:  [261 lb 6.4 oz (118.57 kg)] 261 lb 6.4 oz (118.57 kg) (12/17 0500)  Intake/Output from previous day: 12/16 0701 - 12/17 0700 In: -  Out: 300 [Urine:300]   Physical Exam: General: Well developed, well nourished, in no acute distress. Head:  Normocephalic and atraumatic. Lungs: Clear to auscultation and percussion. Heart: Normal S1 and S2. Occasional ectopy.  No murmur, rubs or gallops.  Abdomen: soft, non-tender, positive bowel sounds. Extremities: No clubbing or cyanosis. No edema. Neurologic: Alert and oriented x 3.    Lab Results:  Recent Labs  09/09/13 0600  WBC 4.1  HGB 15.6  PLT 297    Recent Labs  09/09/13 0600 09/10/13 0640  NA 138 138  K 4.7 4.6  CL 103 103  CO2 22 23  GLUCOSE 108* 124*  BUN 15 18  CREATININE 1.25 1.28   Imaging: Dg Chest 2 View  09/09/2013   CLINICAL DATA:  Shortness of breath.  EXAM: CHEST  2 VIEW  COMPARISON:  September 07, 2013.  FINDINGS: The heart size and mediastinal contours are within normal limits. Both lungs are clear. No pleural effusion or pneumothorax is noted. Ossification of anterior longitudinal ligament is noted.  IMPRESSION: No active cardiopulmonary disease.   Electronically Signed   By: Roque Lias M.D.   On: 09/09/2013 08:27   Personally viewed.   Telemetry: NSR PVC's Personally viewed.   Cardiac Studies:  ECHO 09/08/13:   - Left ventricle: The cavity size was moderately dilated. Systolic function was severely reduced. The estimated ejection fraction was in the range of 20% to 25%. Diffuse hypokinesis. Features are  consistent with a pseudonormal left ventricular filling pattern, with concomitant abnormal relaxation without increased filling pressure (grade 2 diastolic dysfunction). - Left atrium: The atrium was severely dilated. - Right ventricle: The cavity size was mildly dilated. Wall thickness was normal.   Assessment/Plan:  Principal Problem:   Acute systolic heart failure Active Problems:   Obesity, unspecified   Renal insufficiency  1) Acute systolic heart failure with newly diagnosed cardiomyopathy   - Will change to PO lasix 40mg  PO QD today  - Tried to add lisinopril 5mg  but BP too low. Cut back to 2.5mg .   - Will add low dose beta blocker, metoprolol ER 25mg  PO QD.   - Weight down from 282 to 264.    - Creat stable.   - Possible viral cardiomyopathy with recent respiratory illness.   2) CKD - 3 - montior stable  3) Obesity - weight loss  4) Hypotension - will likely have difficulty in titrating medication.   I am OK with DC today. Will have follow up with me in about a week.   Noah Floyd 09/10/2013, 8:57 AM

## 2013-09-10 NOTE — Discharge Summary (Signed)
Physician Discharge Summary  Patient ID: Noah Floyd MRN: [id number] DOB/AGE: 1966/07/14 47 y.o.  Admit date: 09/07/2013 Discharge date: 09/10/2013  Primary Care Physician:  No PCP Per Patient  Discharge Diagnoses:    . Acute systolic CHF/heart failure with pulmonary edema  . Obesity, unspecified . Renal insufficiency, chronic, stage III Community-acquired Pneumonia   Consults: Cardiology Dr. Donato Schultz  Outpatient followup - Recheck BMET for renal function, patient started on Lasix  Allergies:  No Known Allergies   Discharge Medications:   Medication List         furosemide 40 MG tablet  Commonly known as:  LASIX  Take 1 tablet (40 mg total) by mouth daily.     GOODY HEADACHE PO  Take 1 Package by mouth every 6 (six) hours as needed (headache).     levofloxacin 750 MG tablet  Commonly known as:  LEVAQUIN  Take 1 tablet (750 mg total) by mouth daily. X 5 more days     lisinopril 2.5 MG tablet  Commonly known as:  PRINIVIL,ZESTRIL  Take 1 tablet (2.5 mg total) by mouth daily.     metoprolol succinate 25 MG 24 hr tablet  Commonly known as:  TOPROL-XL  Take 1 tablet (25 mg total) by mouth daily.     Potassium Chloride ER 20 MEQ Tbcr  Take 40 mEq by mouth daily.         Brief H and P: For complete details please refer to admission H and P, but in brief Noah Floyd is a 47 y.o. male with no PMH presents with several weeks of worsening DOE. Leg edema. Weight gain, PND. Had a cold prior to onset, but no cough, F/C currently. No h/o same. In ed, had CXR showing asymmetric pulmonary edema and pBNP >3000. Received lasix, and slightly less SOB.   Hospital Course:   47 year old with no significant PMH who presents with dyspnea on exertion, history of viral infection 2 weeks ago. He was found to have EF 20 %.  1-Acute Systolic CHF (congestive heart failure) exacerbation with a newly diagnosed cardiomyopathy; patient presented with dyspnea, cough. Chest x ray on  admission showed asymmetric pulmonary edema with cardiomegaly. BNP 3249. 2-D echo was done which showed EF of  20 %. Cardiology was consulted and patient was placed on IV Lasix. Lisinopril was decrease to 2.5mg  to avoid hypotension. Most likely precipitated due to viral cardiomyopathy and recent respiratory illness. Weight; 127---121--119 Kg.  Negative 3 L.  Repeated Chest X ray 12-16 with no active cardiopulmonary diseases.  HIV negative.  He was started on Toprol-XL  2-Renal insufficiency: Creatinine has stabilized at 1.28. Lasix has been decreased down to 40 mg oral daily.  3-PNA; Continue with Levaquin.    Day of Discharge BP 118/61  Pulse 93  Temp(Src) 98.9 F (37.2 C) (Oral)  Resp 20  Ht 6' (1.829 m)  Wt 118.57 kg (261 lb 6.4 oz)  BMI 35.44 kg/m2  SpO2 94%  Physical Exam: General: Alert and awake oriented x3 not in any acute distress. CVS: S1-S2 clear no murmur rubs or gallops Chest: clear to auscultation bilaterally, no wheezing rales or rhonchi Abdomen: soft nontender, nondistended, normal bowel sounds Extremities: no cyanosis, clubbing or edema noted bilaterally Neuro: Cranial nerves II-XII intact, no focal neurological deficits   The results of significant diagnostics from this hospitalization (including imaging, microbiology, ancillary and laboratory) are listed below for reference.    LAB RESULTS: Basic Metabolic Panel:  Recent Labs Lab 09/07/13 1015  09/09/13 0600 09/10/13 0640  NA  --   < > 138 138  K  --   < > 4.7 4.6  CL  --   < > 103 103  CO2  --   < > 22 23  GLUCOSE  --   < > 108* 124*  BUN  --   < > 15 18  CREATININE  --   < > 1.25 1.28  CALCIUM  --   < > 9.0 9.3  MG 1.9  --   --   --   < > = values in this interval not displayed. Liver Function Tests:  Recent Labs Lab 09/07/13 0420  AST 34  ALT 36  ALKPHOS 72  BILITOT 0.5  PROT 6.6  ALBUMIN 3.6   No results found for this basename: LIPASE, AMYLASE,  in the last 168 hours No results  found for this basename: AMMONIA,  in the last 168 hours CBC:  Recent Labs Lab 09/07/13 0420 09/09/13 0600  WBC 4.3 4.1  NEUTROABS 2.3  --   HGB 15.1 15.6  HCT 43.7 45.4  MCV 81.8 82.1  PLT 287 297   Cardiac Enzymes:  Recent Labs Lab 09/07/13 0420  TROPONINI <0.30   BNP: No components found with this basename: POCBNP,  CBG: No results found for this basename: GLUCAP,  in the last 168 hours  Significant Diagnostic Studies:  Dg Chest 2 View  09/07/2013   CLINICAL DATA:  Shortness of breath for 10 days.  EXAM: CHEST  2 VIEW  COMPARISON:  None.  FINDINGS: The lungs are well-aerated. Patchy left-sided airspace opacity raises concern for pneumonia. Mild asymmetric pulmonary edema is considered less likely, though there is underlying vascular congestion. No pleural effusion or pneumothorax is seen.  The heart is enlarged.  No acute osseous abnormalities are seen.  IMPRESSION: Patchy left-sided airspace opacity raises concern for pneumonia. There is underlying vascular congestion and cardiomegaly, and mild asymmetric pulmonary edema might have a similar appearance.   Electronically Signed   By: Roanna Raider M.D.   On: 09/07/2013 05:29    2D ECHO:  Study Conclusions  - Left ventricle: The cavity size was moderately dilated. Systolic function was severely reduced. The estimated ejection fraction was in the range of 20% to 25%. Diffuse hypokinesis. Features are consistent with a pseudonormal left ventricular filling pattern, with concomitant abnormal relaxation without increased filling pressure (grade 2 diastolic dysfunction). - Left atrium: The atrium was severely dilated. - Right ventricle: The cavity size was mildly dilated. Wall thickness was normal.   Disposition and Follow-up:     Discharge Orders   Future Appointments Provider Department Dept Phone   09/17/2013 10:30 AM Donato Schultz, MD [facility] [address] [phone]   Future Orders Complete By Expires    (HEART FAILURE PATIENTS) Call MD:  Anytime you have any of the following symptoms: 1) 3 pound weight gain in 24 hours or 5 pounds in 1 week 2) shortness of breath, with or without a dry hacking cough 3) swelling in the hands, feet or stomach 4) if you have to sleep on extra pillows at night in order to breathe.  As directed    Diet - low sodium heart healthy  As directed    Increase activity slowly  As directed        DISPOSITION: Home  DIET: Heart healthy   DISCHARGE FOLLOW-UP Follow-up Information   Follow up with Donato Schultz, MD On 09/17/2013. (10:30 AM)  Specialty:  Cardiology   Contact information:   [address] [place] [address] [phone]       Time spent on Discharge: 40 minutes  Signed:   RAI,RIPUDEEP M.D. [facility] 09/10/2013, 10:37 AM Pager: [phone]

## 2013-09-17 ENCOUNTER — Encounter: Payer: BC Managed Care – PPO | Admitting: Cardiology

## 2013-09-19 ENCOUNTER — Encounter: Payer: Self-pay | Admitting: Cardiology

## 2013-09-19 ENCOUNTER — Ambulatory Visit (INDEPENDENT_AMBULATORY_CARE_PROVIDER_SITE_OTHER): Payer: BC Managed Care – PPO | Admitting: Cardiology

## 2013-09-19 VITALS — BP 150/72 | HR 97 | Ht 72.0 in | Wt 269.0 lb

## 2013-09-19 DIAGNOSIS — E669 Obesity, unspecified: Secondary | ICD-10-CM

## 2013-09-19 DIAGNOSIS — I5022 Chronic systolic (congestive) heart failure: Secondary | ICD-10-CM

## 2013-09-19 NOTE — Progress Notes (Signed)
[address] [place], [place]  [address] Phone: [phone] Fax:  [phone]  Date:  09/19/2013   ID:  Noah Floyd, DOB Dec 03, 1965, MRN [id number]  PCP:  No PCP Per Patient   History of Present Illness: Noah Floyd is a 47 y.o. 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  Weight was 264 pounds on discharge. He was 282 on admission. Low-dose beta blocker was added on discharge, lisinopril 2.5 only because of hypotension. Lasix 40 mg by mouth daily. Encouraged him to take these medications. He has not filled his prescription yet for metoprolol or lisinopril.   Wt Readings from Last 3 Encounters:  09/19/13 269 lb (122.018 kg)  09/10/13 261 lb 6.4 oz (118.57 kg)     Past Medical History  Diagnosis Date  . Acute systolic heart failure 09/07/2013    EF 20% on 09/08/13. 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 Mandible fracture surgery      Current Outpatient Prescriptions  Medication Sig Dispense Refill  . furosemide (LASIX) 40 MG tablet Take 1 tablet (40 mg total) by mouth daily.  30 tablet  4  . levofloxacin (LEVAQUIN) 750 MG tablet Take 1 tablet (750 mg total) by mouth daily. X 5 more days  5 tablet  0  . lisinopril (PRINIVIL,ZESTRIL) 2.5 MG tablet Take 1 tablet (2.5 mg total) by mouth daily.  30 tablet  4  . metoprolol succinate (TOPROL-XL) 25 MG 24 hr tablet Take 1 tablet (25 mg total) by mouth daily.  30 tablet  4  . potassium chloride 20 MEQ TBCR Take 40 mEq by mouth daily.  60 tablet  4   No current facility-administered medications for this visit.    Allergies:   No Known Allergies  Social History:  The patient  reports that he has never smoked. He has never used smokeless tobacco. He reports that he does not drink alcohol.    ROS:  Please see the history of present illness.   Denies any syncope, bleeding, orthopnea, PND   PHYSICAL EXAM: VS:  BP 150/72  Pulse 97  Ht 6' (1.829 m)  Wt 269 lb (122.018 kg)  BMI 36.48 kg/m2 Well nourished, well developed, in no acute distress HEENT: normal Neck: no JVD Cardiac:  normal S1, S2; RRR; no murmur Lungs:  clear to auscultation bilaterally, no wheezing, rhonchi or rales Abd: soft, nontender, no hepatomegalyOverweight Ext: no significant edema Skin: warm and dry Neuro: no focal abnormalities noted  EKG:  09/09/13:  Sinus rhythm rate 84, poor R wave progression  ECHO 09/08/13:   - Left ventricle: The cavity size was moderately dilated. Systolic function was severely reduced. The estimated ejection fraction was in the range of 20% to 25%. Diffuse hypokinesis. Features are consistent with a pseudonormal left ventricular filling pattern, with concomitant abnormal relaxation without increased filling pressure (grade 2 diastolic dysfunction). - Left atrium: The atrium was severely dilated. - Right ventricle: The cavity size was mildly dilated. Wall thickness was normal.  TSH 0.74-normal, creatinine 1.28, potassium 4.6   ASSESSMENT AND PLAN:  1. Chronic systolic heart failure/cardiomyopathy-possible viral. Ejection fraction 20% on 09/08/13. Encouraged him to go ahead and fill his lisinopril 2.5 mg once a day and metoprolol succinate 25 mg once a day. We will see him  back in 2 weeks. We will try to up titrate his medications at that time. During hospitalization, blood pressure was quite low at the end of visit. Also, I have instructed him to take an extra Lasix if his weight increases 3-5 pounds. Expressed the importance of medication compliance, salt restriction, fluid restriction. 2. Obesity-encourage weight loss.  Signed, Donato Schultz, MD [facility]  09/19/2013 3:52 PM

## 2013-09-19 NOTE — Patient Instructions (Signed)
Your physician recommends that you continue on your current medications as directed. Please refer to the Current Medication list given to you today.  We encourage you to fill the Rx's for Lisinopril and Metoprolol   Your physician recommends that you schedule a follow-up appointment in: 2 Weeks with Dr Anne Fu

## 2013-09-22 ENCOUNTER — Telehealth: Payer: Self-pay | Admitting: Cardiology

## 2013-09-22 NOTE — Telephone Encounter (Signed)
New Message  Pt called// requests a call back to discuss if he can begin westling again. Please call back to discuss.

## 2013-10-10 ENCOUNTER — Encounter: Payer: Self-pay | Admitting: Cardiology

## 2013-10-10 ENCOUNTER — Ambulatory Visit (INDEPENDENT_AMBULATORY_CARE_PROVIDER_SITE_OTHER): Payer: BC Managed Care – PPO | Admitting: Cardiology

## 2013-10-10 VITALS — BP 154/80 | HR 51 | Ht 72.0 in | Wt 268.0 lb

## 2013-10-10 DIAGNOSIS — I5022 Chronic systolic (congestive) heart failure: Secondary | ICD-10-CM

## 2013-10-10 MED ORDER — LISINOPRIL 2.5 MG PO TABS: 5.0000 mg | ORAL_TABLET | Freq: Every day | ORAL | Status: DC

## 2013-10-10 MED ORDER — METOPROLOL SUCCINATE ER 25 MG PO TB24: 50.0000 mg | ORAL_TABLET | Freq: Every day | ORAL | Status: DC

## 2013-10-10 MED ORDER — LISINOPRIL 2.5 MG PO TABS
5.0000 mg | ORAL_TABLET | Freq: Every day | ORAL | Status: DC
Start: 2013-10-10 — End: 2013-11-03

## 2013-10-10 NOTE — Progress Notes (Signed)
[address] [place], [place]  [address] Phone: [phone] Fax:  [phone]  Date:  10/10/2013   ID:  Noah Floyd, DOB 1966-05-09, MRN [id number]  PCP:  No PCP Per Patient   History of Present Illness: Noah Floyd is a 48 y.o. 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  Weight was 264 pounds on discharge. He was 282 on admission. Low-dose beta blocker was added on discharge, lisinopril 2.5 only because of hypotension. Lasix 40 mg by mouth daily.  10/10/13-tolerated his Toprol 25 mg and lisinopril 2.5 mg well. Blood pressure mildly elevated currently. We will go ahead and double these medications. No orthopnea, no PND. No edema. His weight is up slightly.  Wt Readings from Last 3 Encounters:  10/10/13 268 lb (121.564 kg)  09/19/13 269 lb (122.018 kg)  09/10/13 261 lb 6.4 oz (118.57 kg)     Past Medical History  Diagnosis Date  . Acute systolic heart failure 09/07/2013    EF 20% on 09/08/13. 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 Mandible fracture surgery      Current Outpatient Prescriptions  Medication Sig Dispense Refill  . furosemide (LASIX) 40 MG tablet Take 1 tablet (40 mg total) by mouth daily.  30 tablet  4  . lisinopril (PRINIVIL,ZESTRIL) 2.5 MG tablet Take 1 tablet (2.5 mg total) by mouth daily.  30 tablet  4  . metoprolol succinate (TOPROL-XL) 25 MG 24 hr tablet Take 1 tablet (25 mg total) by mouth daily.  30 tablet  4  . potassium chloride 20 MEQ TBCR Take 40 mEq by mouth daily.  60 tablet  4   No current facility-administered medications for this visit.    Allergies:    Allergies  Allergen Reactions  . Ibuprofen     Social History:  The patient  reports that he has never smoked. He has never used smokeless tobacco.  He reports that he does not drink alcohol.   ROS:  Please see the history of present illness.   Denies any syncope, bleeding, orthopnea, PND   PHYSICAL EXAM: VS:  BP 154/80  Pulse 51  Ht 6' (1.829 m)  Wt 268 lb (121.564 kg)  BMI 36.34 kg/m2  SpO2 96% Well nourished, well developed, in no acute distress HEENT: normal Neck: no JVD Cardiac:  normal S1, S2; RRR; no murmur Lungs:  clear to auscultation bilaterally, no wheezing, rhonchi or rales Abd: soft, nontender, no hepatomegalyOverweight Ext: no significant edema Skin: warm and dry Neuro: no focal abnormalities noted  EKG:  09/09/13:  Sinus rhythm rate 84, poor R wave progression  ECHO 09/08/13:   - Left ventricle: The cavity size was moderately dilated. Systolic function was severely reduced. The estimated ejection fraction was in the range of 20% to 25%. Diffuse hypokinesis. Features are consistent with a pseudonormal left ventricular filling pattern, with concomitant abnormal relaxation without increased filling pressure (grade 2 diastolic dysfunction). - Left atrium: The atrium was severely dilated. - Right ventricle: The cavity size was mildly dilated. Wall thickness was normal.  TSH 0.74-normal, creatinine 1.28, potassium 4.6   ASSESSMENT AND PLAN:  1. Chronic systolic heart failure/cardiomyopathy-possible viral. Ejection fraction 20% on 09/08/13. Increasing lisinopril and metoprolol to 5 mg and 50 mg respectively. During hospitalization, blood pressure was quite  low at the end of visit. Also, I have instructed him to take an extra Lasix if his weight increases 3-5 pounds. Expressed the importance of medication compliance, salt restriction, fluid restriction. 2. Obesity-encourage weight loss. 3. He is interested in getting back to exercising/wrestling. I believe that this is fine. If he is experiencing any symptoms, slow down, stop.  Signed, Noah SchultzMark Keara Pagliarulo, MD [facility]  10/10/2013 4:02 PM

## 2013-10-10 NOTE — Patient Instructions (Signed)
Your physician has recommended you make the following change in your medication:   1. Increase Metoprolol to 50 mg once daily. 2. Increase Lisinopril to 5 mg once daily.  Your physician recommends that you schedule a follow-up appointment in: 2 weeks with Dr. Anne Fu

## 2013-11-03 ENCOUNTER — Encounter: Payer: Self-pay | Admitting: Cardiology

## 2013-11-03 ENCOUNTER — Encounter (INDEPENDENT_AMBULATORY_CARE_PROVIDER_SITE_OTHER): Payer: Self-pay

## 2013-11-03 ENCOUNTER — Ambulatory Visit (INDEPENDENT_AMBULATORY_CARE_PROVIDER_SITE_OTHER): Payer: BC Managed Care – PPO | Admitting: Cardiology

## 2013-11-03 VITALS — BP 134/94 | HR 91 | Ht 72.0 in | Wt 272.1 lb

## 2013-11-03 DIAGNOSIS — I5022 Chronic systolic (congestive) heart failure: Secondary | ICD-10-CM

## 2013-11-03 MED ORDER — LISINOPRIL 10 MG PO TABS: 10.0000 mg | ORAL_TABLET | Freq: Every day | ORAL | Status: DC

## 2013-11-03 MED ORDER — METOPROLOL SUCCINATE ER 100 MG PO TB24: 100.0000 mg | ORAL_TABLET | Freq: Every day | ORAL | Status: DC

## 2013-11-03 NOTE — Progress Notes (Signed)
[address] [place], [place]  [address] Phone: [phone] Fax:  [phone]) [phone]  Date:  11/03/2013   ID:  Noah HurstJohn Floyd, DOB 09/29/1965, MRN [id number]  PCP:  No PCP Per Patient   History of Present Illness: Noah Floyd is a 48 y.o. 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  Weight was 264 pounds on discharge. He was 282 on admission. Low-dose beta blocker was added on discharge, lisinopril 2.5 only because of hypotension. Lasix 40 mg by mouth daily.  10/10/13-tolerated his Toprol 25 mg and lisinopril 2.5 mg well. Blood pressure mildly elevated currently. We will go ahead and double these medications. No orthopnea, no PND. No edema. His weight is up slightly.  11/03/13-tolerate medications, taking them at night. He is now wrestling again.  Wt Readings from Last 3 Encounters:  11/03/13 272 lb 1.9 oz (123.433 kg)  10/10/13 268 lb (121.564 kg)  09/19/13 269 lb (122.018 kg)     Past Medical History  Diagnosis Date  . Acute systolic heart failure 09/07/2013    EF 20% on 09/08/13. 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 Mandible fracture surgery      Current Outpatient Prescriptions  Medication Sig Dispense Refill  . furosemide (LASIX) 40 MG tablet Take 1 tablet (40 mg total) by mouth daily.  30 tablet  4  . lisinopril (PRINIVIL,ZESTRIL) 2.5 MG tablet Take 2 tablets (5 mg total) by mouth daily.  60 tablet  4  . metoprolol succinate (TOPROL-XL) 25 MG 24 hr tablet Take 2 tablets (50 mg total) by mouth daily.  60 tablet  4  . potassium chloride 20 MEQ TBCR Take 40 mEq by mouth daily.  60 tablet  4   No current facility-administered medications for this visit.    Allergies:    Allergies  Allergen Reactions  . Ibuprofen     Social History:   The patient  reports that he has never smoked. He has never used smokeless tobacco. He reports that he does not drink alcohol.   ROS:  Please see the history of present illness.   Denies any syncope, bleeding, orthopnea, PND   PHYSICAL EXAM: VS:  BP 134/94  Pulse 91  Ht 6' (1.829 m)  Wt 272 lb 1.9 oz (123.433 kg)  BMI 36.90 kg/m2  SpO2 94% Well nourished, well developed, in no acute distress HEENT: normal Neck: no JVD Cardiac:  normal S1, S2; RRR; no murmur Lungs:  clear to auscultation bilaterally, no wheezing, rhonchi or rales Abd: soft, nontender, no hepatomegalyOverweight Ext: no significant edema Skin: warm and dry Neuro: no focal abnormalities noted  EKG:  09/09/13:  Sinus rhythm rate 84, poor R wave progression  ECHO 09/08/13:   - Left ventricle: The cavity size was moderately dilated. Systolic function was severely reduced. The estimated ejection fraction was in the range of 20% to 25%. Diffuse hypokinesis. Features are consistent with a pseudonormal left ventricular filling pattern, with concomitant abnormal relaxation without increased filling pressure (grade 2 diastolic dysfunction). - Left atrium: The atrium was severely dilated. - Right ventricle: The cavity size was mildly dilated. Wall thickness was normal.  TSH 0.74-normal, creatinine 1.28, potassium 4.6   ASSESSMENT AND PLAN:  1. Chronic systolic heart failure/cardiomyopathy-possible viral. Ejection fraction 20% on 09/08/13. Increasing lisinopril  and metoprolol to 10 mg and 100 mg respectively. Explained to him that we're trying to get to goal of lisinopril 20, metoprolol 200 mg. During hospitalization, blood pressure was quite low at the end of visit. Also, I have instructed him to take an extra Lasix if his weight increases 3-5 pounds. Expressed the importance of medication compliance, salt restriction, fluid restriction. I will check basic metabolic profile today. 2. Obesity-encourage weight  loss. 3. Getting back to exercising/wrestling. I believe that this is fine. If he is experiencing any symptoms, slow down, stop.  Signed, Donato Schultz, MD [facility]  11/03/2013 3:50 PM

## 2013-11-03 NOTE — Patient Instructions (Signed)
Your physician has recommended you make the following change in your medication:   1. Increase Metoprolol to 100mg  once daily. 2. Increase Lisinopril to 10mg  once daily.  Your physician recommends that you have labs today: BMET  Your physician recommends that you schedule a follow-up appointment in: 1 months with Dr. Anne Fu

## 2013-11-04 LAB — BASIC METABOLIC PANEL
BUN: 15 mg/dL (ref 6–23)
CHLORIDE: 106 meq/L (ref 96–112)
CO2: 28 mEq/L (ref 19–32)
Calcium: 9.3 mg/dL (ref 8.4–10.5)
Creatinine, Ser: 1.1 mg/dL (ref 0.4–1.5)
GFR: 87.59 mL/min (ref >60.00)
GLUCOSE: 103 mg/dL — AB (ref 70–99)
POTASSIUM: 4.5 meq/L (ref 3.5–5.1)
Sodium: 140 mEq/L (ref 135–145)

## 2013-11-06 NOTE — Progress Notes (Signed)
Left message for patient to call the office regarding lab work

## 2013-12-01 ENCOUNTER — Ambulatory Visit (INDEPENDENT_AMBULATORY_CARE_PROVIDER_SITE_OTHER): Payer: BC Managed Care – PPO | Admitting: Cardiology

## 2013-12-01 ENCOUNTER — Encounter: Payer: Self-pay | Admitting: Cardiology

## 2013-12-01 VITALS — BP 138/80 | HR 102 | Ht 72.0 in | Wt 277.0 lb

## 2013-12-01 DIAGNOSIS — N289 Disorder of kidney and ureter, unspecified: Secondary | ICD-10-CM

## 2013-12-01 DIAGNOSIS — E669 Obesity, unspecified: Secondary | ICD-10-CM

## 2013-12-01 DIAGNOSIS — I5022 Chronic systolic (congestive) heart failure: Secondary | ICD-10-CM

## 2013-12-01 MED ORDER — LISINOPRIL 10 MG PO TABS: 20.0000 mg | ORAL_TABLET | Freq: Every day | ORAL | Status: DC

## 2013-12-01 MED ORDER — METOPROLOL SUCCINATE ER 100 MG PO TB24: 200.0000 mg | ORAL_TABLET | Freq: Every day | ORAL | Status: DC

## 2013-12-01 NOTE — Patient Instructions (Signed)
Your physician has recommended you make the following change in your medication:   1. Increase Lisinopril to 20 mg once daily 2. Increase Toprolol to 200 mg once daily.  Your physician has requested that you have an echocardiogram. Echocardiography is a painless test that uses sound waves to create images of your heart. It provides your doctor with information about the size and shape of your heart and how well your heart's chambers and valves are working. This procedure takes approximately one hour. There are no restrictions for this procedure.  Your physician recommends that you schedule a follow-up appointment in: 2 months with Dr. Anne Fu.

## 2013-12-01 NOTE — Progress Notes (Signed)
[address] [place], [place]  [address] Phone: [phone] Fax:  [phone]  Date:  12/01/2013   ID:  Noah HurstJohn Coye, DOB 02/14/1966, MRN [id number]  PCP:  No PCP Per Patient   History of Present Illness: Noah Floyd is a 48 y.o. 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  Weight was 264 pounds on discharge. He was 282 on admission. Low-dose beta blocker was added on discharge, lisinopril 2.5 only because of hypotension. Lasix 40 mg by mouth daily.  10/10/13-tolerated his Toprol 25 mg and lisinopril 2.5 mg well. Blood pressure mildly elevated currently. We will go ahead and double these medications. No orthopnea, no PND. No edema. His weight is up slightly.  11/03/13-tolerate medications, taking them at night. He is now wrestling again.  12/01/13 - currently on metoprolol 100 and lisinopril 10. Heart rate 102.   Wt Readings from Last 3 Encounters:  12/01/13 277 lb (125.646 kg)  11/03/13 272 lb 1.9 oz (123.433 kg)  10/10/13 268 lb (121.564 kg)     Past Medical History  Diagnosis Date  . Acute systolic heart failure 09/07/2013    EF 20% on 09/08/13. 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 Mandible fracture surgery      Current Outpatient Prescriptions  Medication Sig Dispense Refill  . furosemide (LASIX) 40 MG tablet Take 1 tablet (40 mg total) by mouth daily.  30 tablet  4  . lisinopril (PRINIVIL,ZESTRIL) 10 MG tablet Take 1 tablet (10 mg total) by mouth daily.  30 tablet  5  . metoprolol succinate (TOPROL XL) 100 MG 24 hr tablet Take 1 tablet (100 mg total) by mouth daily. Take with or immediately following a meal.  30 tablet  5  . potassium chloride 20 MEQ TBCR Take 40 mEq by mouth daily.  60 tablet  4   No current  facility-administered medications for this visit.    Allergies:    Allergies  Allergen Reactions  . Ibuprofen     Social History:  The patient  reports that he has never smoked. He has never used smokeless tobacco. He reports that he does not drink alcohol.   ROS:  Please see the history of present illness.   Denies any syncope, bleeding, orthopnea, PND   PHYSICAL EXAM: VS:  BP 138/80  Pulse 102  Ht 6' (1.829 m)  Wt 277 lb (125.646 kg)  BMI 37.56 kg/m2 Well nourished, well developed, in no acute distress HEENT: normal Neck: no JVD Cardiac:  normal S1, S2; RRR; no murmur Lungs:  clear to auscultation bilaterally, no wheezing, rhonchi or rales Abd: soft, nontender, no hepatomegalyOverweight Ext: no significant edema Skin: warm and dry Neuro: no focal abnormalities noted  EKG:  09/09/13:  Sinus rhythm rate 84, poor R wave progression  ECHO 09/08/13:   - Left ventricle: The cavity size was moderately dilated. Systolic function was severely reduced. The estimated ejection fraction was in the range of 20% to 25%. Diffuse hypokinesis. Features are consistent with a pseudonormal left ventricular filling pattern, with concomitant abnormal relaxation without increased filling pressure (grade 2 diastolic dysfunction). - Left atrium: The atrium was severely dilated. - Right ventricle: The cavity size was mildly dilated. Wall thickness was normal.  TSH 0.74-normal, creatinine 1.28, potassium 4.6  ASSESSMENT AND PLAN:  1. Chronic systolic heart failure/cardiomyopathy-possible viral. Ejection fraction 20% on 09/08/13.  Increasing lisinopril and metoprolol to goal dosage of 20 mg and 200 mg respectively. He has been compliant with his medications. During hospitalization, blood pressure was quite low at the end of visit. Also, I have instructed him to take an extra Lasix if his weight increases 3-5 pounds. Expressed the importance of medication compliance, salt restriction, fluid  restriction. Basic metabolic profile reassuring on 11/03/13. 2. Obesity-encourage weight loss. 3. Getting back to exercising/wrestling.  If he is experiencing any symptoms, slow down, stop. 4. We will check echocardiogram in one month. I will see him back in 2 months or sooner if needed.  Signed, Donato Schultz, MD [facility]  12/01/2013 4:08 PM

## 2013-12-17 ENCOUNTER — Ambulatory Visit (HOSPITAL_COMMUNITY): Payer: BC Managed Care – PPO | Attending: Cardiology | Admitting: Cardiology

## 2013-12-17 DIAGNOSIS — I428 Other cardiomyopathies: Secondary | ICD-10-CM

## 2013-12-17 DIAGNOSIS — I5022 Chronic systolic (congestive) heart failure: Secondary | ICD-10-CM | POA: Insufficient documentation

## 2013-12-17 MED ORDER — PERFLUTREN PROTEIN A MICROSPH IV SUSP
3.0000 mL | Freq: Once | INTRAVENOUS | Status: AC
  Administered 2013-12-17: 3 mL via INTRAVENOUS

## 2013-12-17 NOTE — Progress Notes (Signed)
Limited echo with optison performed.

## 2014-01-06 ENCOUNTER — Telehealth: Payer: Self-pay | Admitting: Cardiology

## 2014-01-06 NOTE — Telephone Encounter (Signed)
Follow up    Pt returning this office's phone call for his test results.

## 2014-01-09 NOTE — Telephone Encounter (Signed)
Patient aware of results.

## 2014-01-23 ENCOUNTER — Other Ambulatory Visit: Payer: Self-pay | Admitting: *Deleted

## 2014-01-23 MED ORDER — FUROSEMIDE 40 MG PO TABS: 40.0000 mg | ORAL_TABLET | Freq: Every day | ORAL | Status: DC

## 2014-02-19 ENCOUNTER — Ambulatory Visit: Payer: BC Managed Care – PPO | Admitting: Cardiology

## 2014-03-04 ENCOUNTER — Other Ambulatory Visit: Payer: Self-pay | Admitting: *Deleted

## 2014-03-04 MED ORDER — FUROSEMIDE 40 MG PO TABS: 40.0000 mg | ORAL_TABLET | Freq: Every day | ORAL | Status: DC

## 2014-03-31 ENCOUNTER — Encounter: Payer: Self-pay | Admitting: Cardiology

## 2014-03-31 ENCOUNTER — Ambulatory Visit (INDEPENDENT_AMBULATORY_CARE_PROVIDER_SITE_OTHER): Payer: BC Managed Care – PPO | Admitting: Cardiology

## 2014-03-31 VITALS — BP 132/90 | HR 64 | Ht 71.0 in | Wt 271.0 lb

## 2014-03-31 DIAGNOSIS — E669 Obesity, unspecified: Secondary | ICD-10-CM

## 2014-03-31 DIAGNOSIS — I5022 Chronic systolic (congestive) heart failure: Secondary | ICD-10-CM

## 2014-03-31 NOTE — Progress Notes (Signed)
[address] [place], [place]  [address] Phone: [phone] Fax:  [phone]  Date:  03/31/2014   ID:  Noah Floyd, DOB [birth year], MRN [id number]  PCP:  No PCP Per Patient   History of Present Illness: Noah Floyd is a 48 y.o. 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  Weight was 264 pounds on discharge. He was 282 on admission. Low-dose beta blocker was added on discharge, lisinopril 2.5 only because of hypotension. Lasix 40 mg by mouth daily.  10/10/13-tolerated his Toprol 25 mg and lisinopril 2.5 mg well. Blood pressure mildly elevated currently. We will go ahead and double these medications. No orthopnea, no PND. No edema. His weight is up slightly.  11/03/13-tolerate medications, taking them at night. He is now wrestling again.  12/01/13 - currently on metoprolol 100 and lisinopril 10. Heart rate 102.   03/31/14-main complaint is fatigue. 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  Wt Readings from Last 3 Encounters:  03/31/14 271 lb (122.925 kg)  12/01/13 277 lb (125.646 kg)  11/03/13 272 lb 1.9 oz (123.433 kg)     Past Medical History  Diagnosis Date  . Acute systolic heart failure 09/07/2013    EF 20% on 09/08/13. 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 Mandible fracture surgery      Current Outpatient Prescriptions  Medication Sig Dispense Refill  . furosemide (LASIX) 40 MG tablet Take 1 tablet (40 mg total) by mouth daily.  30 tablet  0  . lisinopril (PRINIVIL,ZESTRIL) 10 MG tablet Take 2 tablets (20 mg total) by mouth daily.  60 tablet  2  . metoprolol succinate (TOPROL XL) 100 MG 24 hr tablet Take 2 tablets (200 mg total) by mouth daily.  Take with or immediately following a meal.  60 tablet  2  . potassium chloride 20 MEQ TBCR Take 40 mEq by mouth daily.  60 tablet  4   No current facility-administered medications for this visit.    Allergies:    Allergies  Allergen Reactions  . Ibuprofen     Social History:  The patient  reports that he has never smoked. He has never used smokeless tobacco. He reports that he does not drink alcohol.   ROS:  Please see the history of present illness.   Denies any syncope, bleeding, orthopnea, PND   PHYSICAL EXAM: VS:  BP 132/90  Pulse 64  Ht 5\' 11"  (1.803 m)  Wt 271 lb (122.925 kg)  BMI 37.81 kg/m2 Well nourished, well developed, in no acute distress HEENT: normal Neck: no JVD Cardiac:  normal S1, S2; RRR; no murmur Lungs:  clear to auscultation bilaterally, no wheezing, rhonchi or rales Abd: soft, nontender, no hepatomegalyOverweight Ext: no significant edema Skin: warm and dry Neuro: no focal abnormalities noted  EKG:  09/09/13:  Sinus rhythm rate 84, poor R wave progression  ECHO 09/08/13:   - Left ventricle: The cavity size was moderately dilated. Systolic function was severely reduced. The estimated ejection fraction was in the range of 20% to 25%. Diffuse hypokinesis. Features are consistent with a pseudonormal left ventricular filling pattern, with concomitant abnormal relaxation without increased filling pressure (grade 2  diastolic dysfunction). - Left atrium: The atrium was severely dilated. - Right ventricle: The cavity size was mildly dilated. Wall thickness was normal.  Echocardiogram 12/17/13: EF 20-25%-no change  TSH 0.74-normal, creatinine 1.28, potassium 4.6  ASSESSMENT AND PLAN:  1. Chronic systolic heart failure/cardiomyopathy-possible viral. Ejection fraction 20% on 09/08/13 and on repeat echo. Lisinopril and metoprolol to goal dosage of 20 mg and 200 mg respectively. He has been compliant with his medications. During hospitalization, blood  pressure was quite low at the end of visit. Also, I have instructed him to take an extra Lasix if his weight increases 3-5 pounds. Expressed the importance of medication compliance, salt restriction, fluid restriction. Basic metabolic profile reassuring on 11/03/13. 2. Obesity-encourage weight loss. 3. Getting back to exercising/wrestling.  If he is experiencing any symptoms, slow down, stop. Fatigue has been his main issue. Broad the possibility of sleep apnea. We may wish to ponder this in the future. 4. Six-month followup  Signed, Donato SchultzMark Bless Lisenby, MD [facility]  03/31/2014 4:00 PM

## 2014-03-31 NOTE — Patient Instructions (Signed)
The current medical regimen is effective;  continue present plan and medications.  Follow up in 6 months with Dr. Skains.  You will receive a letter in the mail 2 months before you are due.  Please call us when you receive this letter to schedule your follow up appointment.  

## 2014-05-03 ENCOUNTER — Other Ambulatory Visit: Payer: Self-pay | Admitting: Cardiology

## 2014-05-04 ENCOUNTER — Other Ambulatory Visit: Payer: Self-pay | Admitting: *Deleted

## 2014-05-04 MED ORDER — FUROSEMIDE 40 MG PO TABS: ORAL_TABLET | ORAL | Status: DC

## 2014-09-29 ENCOUNTER — Ambulatory Visit (INDEPENDENT_AMBULATORY_CARE_PROVIDER_SITE_OTHER): Payer: BLUE CROSS/BLUE SHIELD | Admitting: Cardiology

## 2014-09-29 ENCOUNTER — Encounter: Payer: Self-pay | Admitting: Cardiology

## 2014-09-29 VITALS — BP 132/90 | HR 91 | Ht 71.0 in | Wt 293.0 lb

## 2014-09-29 DIAGNOSIS — I5022 Chronic systolic (congestive) heart failure: Secondary | ICD-10-CM

## 2014-09-29 DIAGNOSIS — I420 Dilated cardiomyopathy: Secondary | ICD-10-CM

## 2014-09-29 MED ORDER — FUROSEMIDE 40 MG PO TABS: ORAL_TABLET | ORAL | Status: DC

## 2014-09-29 NOTE — Patient Instructions (Signed)
The current medical regimen is effective;  continue present plan and medications.  Your physician has requested that you have a cardiac catheterization. Cardiac catheterization is used to diagnose and/or treat various heart conditions. Doctors may recommend this procedure for a number of different reasons. The most common reason is to evaluate chest pain. Chest pain can be a symptom of coronary artery disease (CAD), and cardiac catheterization can show whether plaque is narrowing or blocking your heart's arteries. This procedure is also used to evaluate the valves, as well as measure the blood flow and oxygen levels in different parts of your heart. For further information please visit [email or web address] Please follow instruction sheet, as given.  Please call back to be scheduled for cath once you speak with your wife.  Follow up in 6 months (or 2 weeks after cath) with Dr. Anne Fu.  You will receive a letter in the mail 2 months before you are due.  Please call us when you receive this letter to schedule your follow up appointment.

## 2014-09-29 NOTE — Progress Notes (Signed)
[address] [place], [place]  [address] Phone: [phone] Fax:  [phone]  Date:  09/29/2014   ID:  Noah Floyd, DOB Jan 29, 1966, MRN [id number]  PCP:  No PCP Per Patient   History of Present Illness: Noah Floyd is a 49 y.o. 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  Weight was 264 pounds on discharge. He was 282 on admission. Low-dose beta blocker was added on discharge, lisinopril 2.5 only because of hypotension. Lasix 40 mg by mouth daily.  10/10/13-tolerated his Toprol 25 mg and lisinopril 2.5 mg well. Blood pressure mildly elevated currently. We will go ahead and double these medications. No orthopnea, no PND. No edema. His weight is up slightly.  11/03/13-tolerate medications, taking them at night. He is now wrestling again.  12/01/13 - currently on metoprolol 100 and lisinopril 10. Heart rate 102.   03/31/14-main complaint is fatigue. 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  09/29/14-his weight has increased approximately 20 pounds. Overall Ace feeling well. No shortness of breath. He has noted lower extremity edema. He ran out of Lasix.  Wt Readings from Last 3 Encounters:  09/29/14 293 lb (132.904 kg)  03/31/14 271 lb (122.925 kg)  12/01/13 277 lb (125.646 kg)     Past Medical History  Diagnosis Date  . Acute systolic heart failure 09/07/2013    EF 20% on 09/08/13. 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 Mandible fracture surgery      Current Outpatient Prescriptions  Medication Sig Dispense Refill  . furosemide (LASIX) 40 MG tablet TAKE ONE TABLET BY MOUTH ONCE DAILY 90 tablet 1  . lisinopril (PRINIVIL,ZESTRIL) 10 MG tablet Take 2 tablets  (20 mg total) by mouth daily. 60 tablet 2  . metoprolol succinate (TOPROL XL) 100 MG 24 hr tablet Take 2 tablets (200 mg total) by mouth daily. Take with or immediately following a meal. 60 tablet 2  . potassium chloride 20 MEQ TBCR Take 40 mEq by mouth daily. 60 tablet 4   No current facility-administered medications for this visit.    Allergies:    Allergies  Allergen Reactions  . Ibuprofen     Social History:  The patient  reports that he has never smoked. He has never used smokeless tobacco. He reports that he does not drink alcohol.   ROS:  Please see the history of present illness.   Denies any syncope, bleeding, orthopnea, PND   PHYSICAL EXAM: VS:  BP 132/90 mmHg  Pulse 91  Ht 5\' 11"  (1.803 m)  Wt 293 lb (132.904 kg)  BMI 40.88 kg/m2 Well nourished, well developed, in no acute distress HEENT: normal Neck: no JVD Cardiac:  normal S1, S2; RRR; no murmur Lungs:  clear to auscultation bilaterally, no wheezing, rhonchi or rales Abd: soft, nontender, no hepatomegalyOverweight Ext: 2+ BLEsignificant edema Skin: warm and dry Neuro: no focal abnormalities noted  EKG:  09/29/14-sinus rhythm, PVC, nonspecific T-wave changes. Heart rate 91. 09/09/13:  Sinus rhythm rate 84, poor R wave progression  ECHO 09/08/13:   - Left ventricle: The cavity size was moderately dilated. Systolic function was severely reduced. The estimated ejection fraction was in the range of  20% to 25%. Diffuse hypokinesis. Features are consistent with a pseudonormal left ventricular filling pattern, with concomitant abnormal relaxation without increased filling pressure (grade 2 diastolic dysfunction). - Left atrium: The atrium was severely dilated. - Right ventricle: The cavity size was mildly dilated. Wall thickness was normal.  Echocardiogram 12/17/13: EF 20-25%-no change  TSH 0.74-normal, creatinine 1.28, potassium 4.6  ASSESSMENT AND PLAN:  1. Chronic systolic heart  failure/cardiomyopathy-possible viral. Ejection fraction 20% on 09/08/13 and on repeat echo. Lisinopril and metoprolol to goal dosage of 20 mg and 200 mg respectively. He has been compliant with his medications. However, he missed his Lasix recently. Encouraged use. During hospitalization, blood pressure was quite low at the end of visit. Also, I have instructed him to take an extra Lasix if his weight increases 3-5 pounds. Expressed the importance of medication compliance, salt restriction, fluid restriction. Basic metabolic profile reassuring on 11/03/13. Ultimately, we discussed the need for diagnostic heart catheterization to exclude coronary artery disease. He is going to check with his wife regarding his insurance. He will call us back. Risks and benefits of procedure including stroke, heart attack, death were discussed. We will proceed with radial artery approach once he is okay with insurance. 2. Morbid Obesity-encourage weight loss. Recent increased 20 pounds. 3. Getting back to exercising/wrestling.  If he is experiencing any symptoms, slow down, stop. Fatigue has been his main issue. Broad the possibility of sleep apnea. We may wish to ponder this in the future. 4. Six-month followup  Signed, Donato Schultz, MD [facility]  09/29/2014 10:25 AM

## 2014-12-23 IMAGING — CR DG CHEST 2V
2 series · 2 of 2 positions shown · non-contrast
Comparison: None.

CLINICAL DATA: Shortness of breath for 10 days.

EXAM:
CHEST  2 VIEW

[w chest pa]
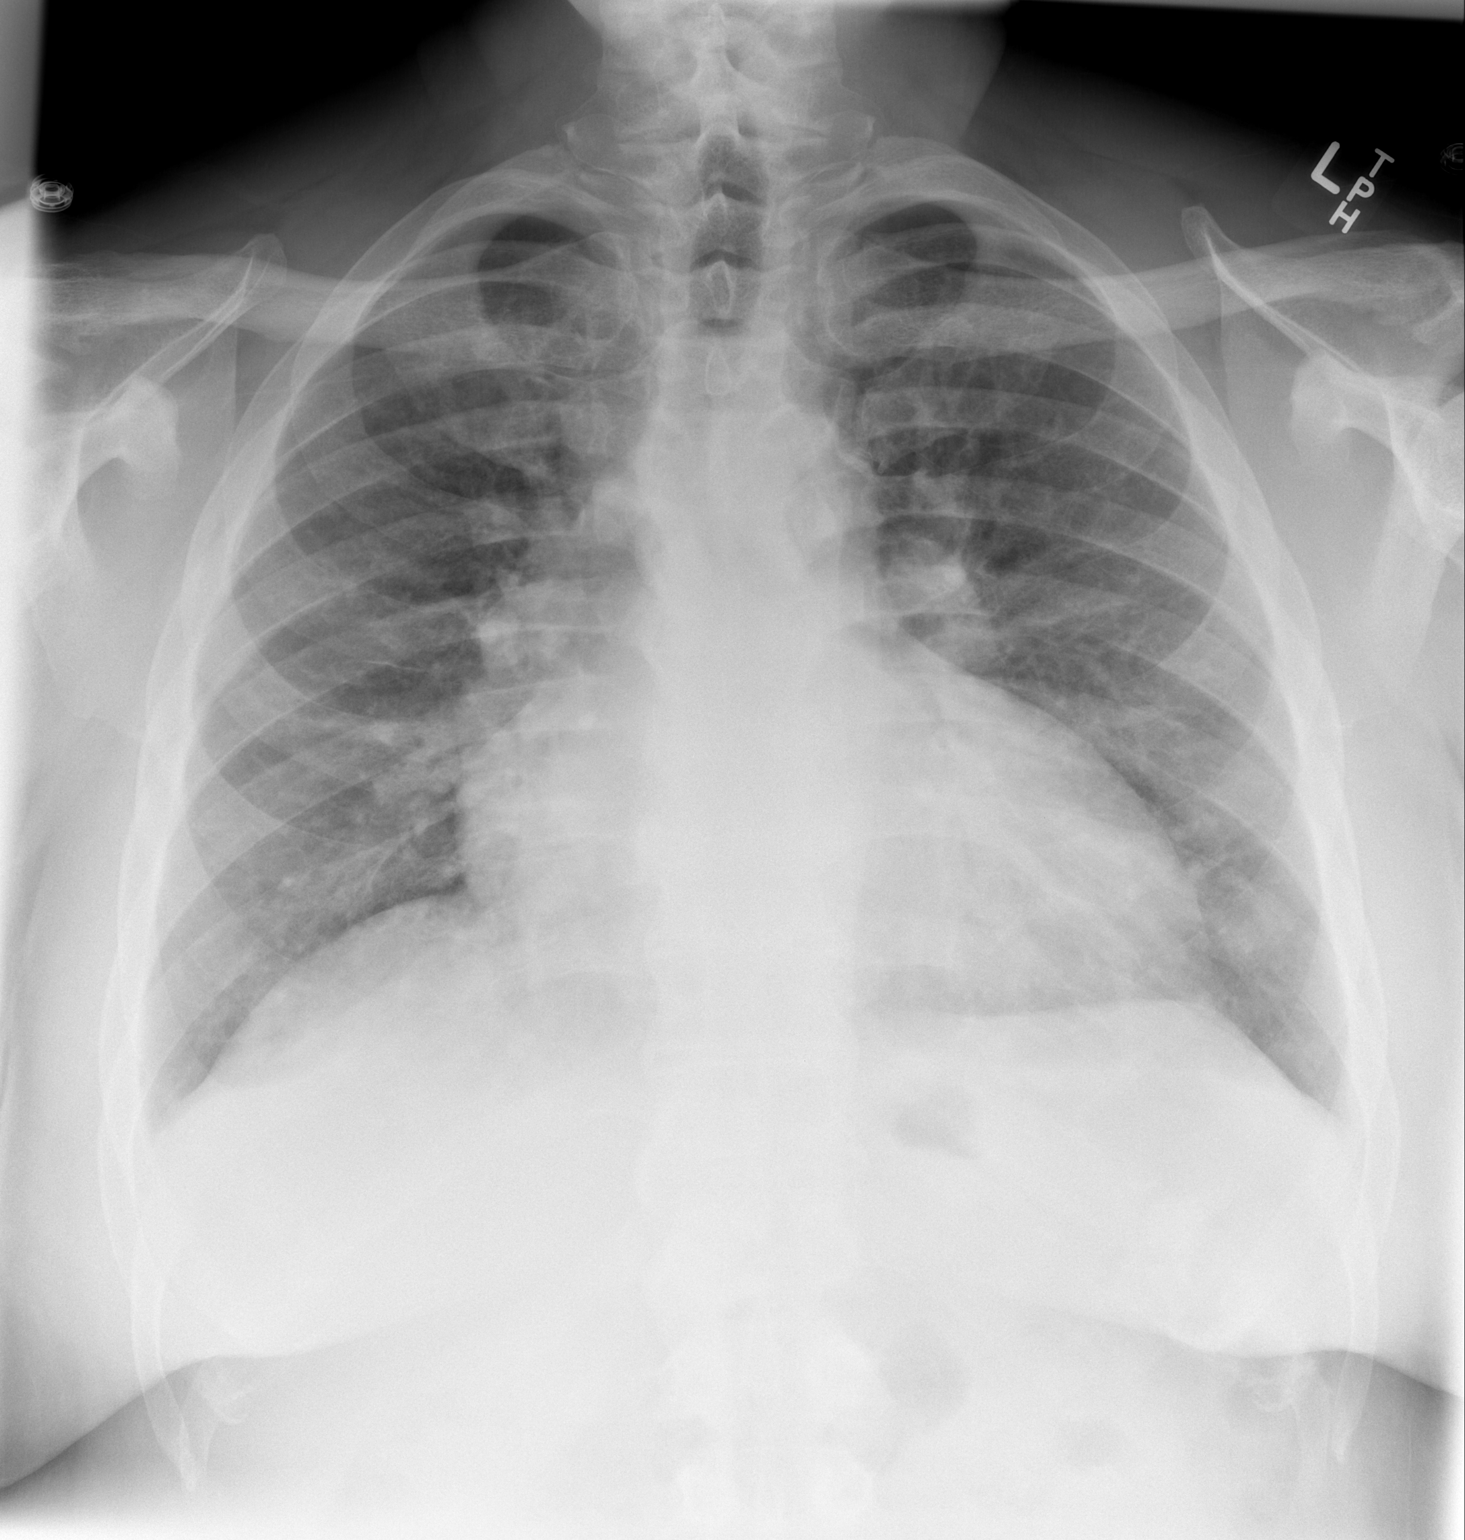

[w chest lat]
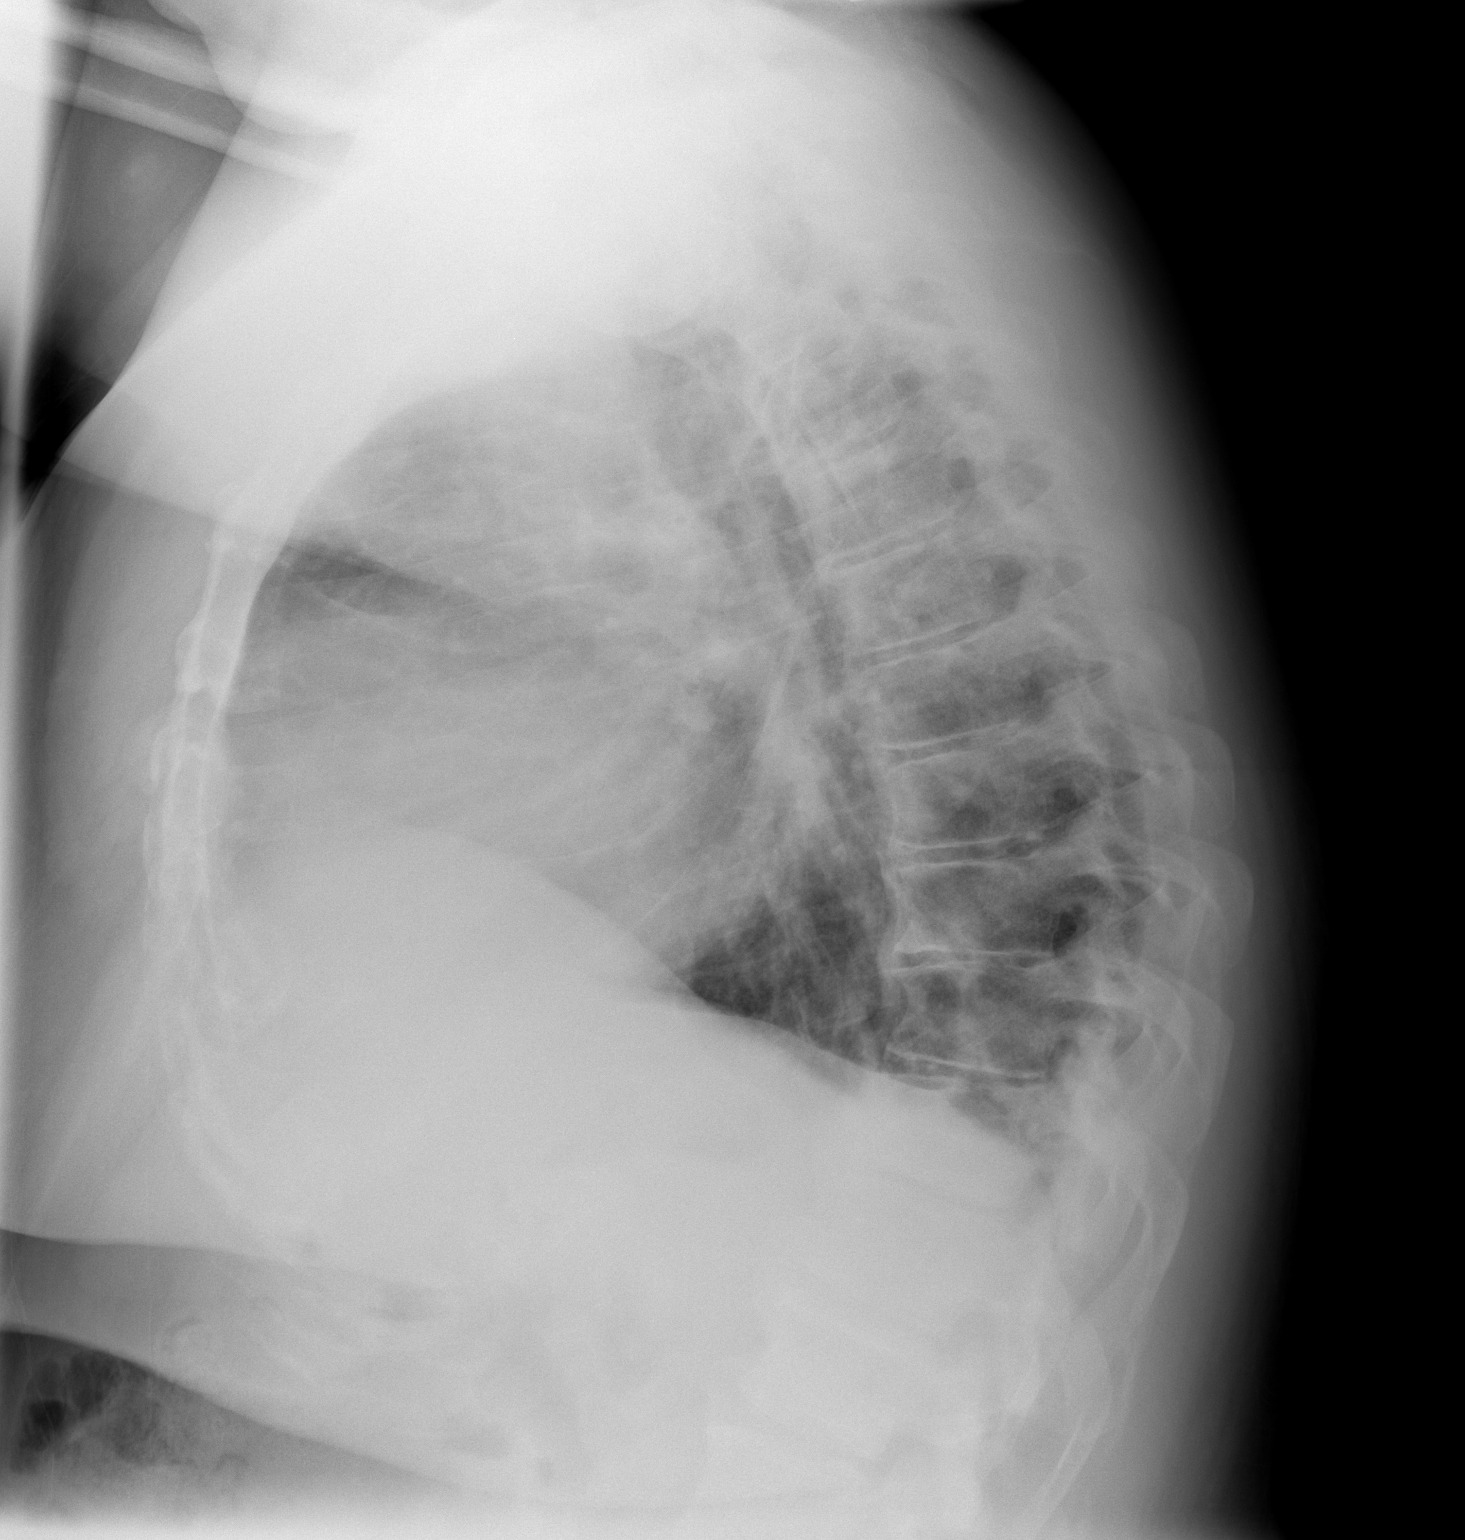

[2 of 2 positions shown; findings below may reference images not displayed]

FINDINGS: The lungs are well-aerated. Patchy left-sided airspace opacity
raises concern for pneumonia. Mild asymmetric pulmonary edema is
considered less likely, though there is underlying vascular
congestion. No pleural effusion or pneumothorax is seen.

The heart is enlarged.  No acute osseous abnormalities are seen.
IMPRESSION: Patchy left-sided airspace opacity raises concern for pneumonia.
There is underlying vascular congestion and cardiomegaly, and mild
asymmetric pulmonary edema might have a similar appearance.

## 2015-01-29 ENCOUNTER — Ambulatory Visit: Payer: Self-pay | Attending: Internal Medicine

## 2015-02-02 ENCOUNTER — Other Ambulatory Visit: Payer: Self-pay | Admitting: *Deleted

## 2015-02-02 MED ORDER — POTASSIUM CHLORIDE ER 20 MEQ PO TBCR: 40.0000 meq | EXTENDED_RELEASE_TABLET | Freq: Every day | ORAL | Status: DC

## 2015-03-16 ENCOUNTER — Other Ambulatory Visit: Payer: Self-pay

## 2015-03-16 MED ORDER — POTASSIUM CHLORIDE ER 20 MEQ PO TBCR: 40.0000 meq | EXTENDED_RELEASE_TABLET | Freq: Every day | ORAL | Status: DC

## 2015-04-05 ENCOUNTER — Ambulatory Visit (INDEPENDENT_AMBULATORY_CARE_PROVIDER_SITE_OTHER): Payer: BLUE CROSS/BLUE SHIELD | Admitting: Cardiology

## 2015-04-05 ENCOUNTER — Encounter: Payer: Self-pay | Admitting: Cardiology

## 2015-04-05 VITALS — BP 132/100 | HR 87 | Ht 72.0 in | Wt 311.5 lb

## 2015-04-05 DIAGNOSIS — I5022 Chronic systolic (congestive) heart failure: Secondary | ICD-10-CM

## 2015-04-05 MED ORDER — LISINOPRIL 10 MG PO TABS: 10.0000 mg | ORAL_TABLET | Freq: Every day | ORAL | Status: DC

## 2015-04-05 MED ORDER — METOPROLOL SUCCINATE ER 100 MG PO TB24: 100.0000 mg | ORAL_TABLET | Freq: Every day | ORAL | Status: DC

## 2015-04-05 NOTE — Patient Instructions (Signed)
Medication Instructions:  Please restart your Metoprolol 100 mg a day and Lisinopril 10 mg a day.  Continue all other medications as listed.  Please check your blood pressure in 2 weeks and call with the results.  Your medication may need to be further adjusted.  [phone].  Follow-Up: Follow up in 3 months with Dr. Anne Fu.  You will receive a letter in the mail 2 months before you are due.  Please call us when you receive this letter to schedule your follow up appointment.  Thank you for choosing [facility]!!

## 2015-04-05 NOTE — Progress Notes (Signed)
[address] [place], [place]  [address] Phone: [phone] Fax:  [phone]  Date:  04/05/2015   ID:  Dimitri Schuur, DOB 1966-08-08, MRN [id number]  PCP:  No PCP Per Patient   History of Present Illness: Noah Floyd is a 49 y.o. 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  Weight was 264 pounds on discharge. He was 282 on admission. Low-dose beta blocker was added on discharge, lisinopril 2.5 only because of hypotension. Lasix 40 mg by mouth daily.  10/10/13-tolerated his Toprol 25 mg and lisinopril 2.5 mg well. Blood pressure mildly elevated currently. We will go ahead and double these medications. No orthopnea, no PND. No edema. His weight is up slightly.  11/03/13-tolerate medications, taking them at night. He is now wrestling again.  12/01/13 - currently on metoprolol 100 and lisinopril 10. Heart rate 102.   03/31/14-main complaint is fatigue. 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  09/29/14-his weight has increased approximately 20 pounds. Overall Ace feeling well. No shortness of breath. He has noted lower extremity edema. He ran out of Lasix.  04/05/15 - month ago chest pain. BP was elevated. Had stopped his meds. No SOB. Weight up.   Wt Readings from Last 3 Encounters:  04/05/15 311 lb 8 oz (141.295 kg)  09/29/14 293 lb (132.904 kg)  03/31/14 271 lb (122.925 kg)     Past Medical History  Diagnosis Date  . Acute systolic heart failure 09/07/2013    EF 20% on 09/08/13. 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 Mandible fracture surgery      Current Outpatient Prescriptions  Medication Sig Dispense Refill  . furosemide (LASIX) 40 MG tablet TAKE ONE  TABLET BY MOUTH ONCE DAILY 90 tablet 3  . Potassium Chloride ER 20 MEQ TBCR Take 40 mEq by mouth daily. 60 tablet 0   No current facility-administered medications for this visit.    Allergies:    Allergies  Allergen Reactions  . Ibuprofen     headache    Social History:  The patient  reports that he has never smoked. He has never used smokeless tobacco. He reports that he does not drink alcohol.   ROS:  Please see the history of present illness.   Denies any syncope, bleeding, orthopnea, PND   PHYSICAL EXAM: VS:  BP 132/100 mmHg  Pulse 87  Ht 6' (1.829 m)  Wt 311 lb 8 oz (141.295 kg)  BMI 42.24 kg/m2  SpO2 98% Well nourished, well developed, in no acute distress HEENT: normal Neck: no JVD Cardiac:  normal S1, S2; RRR; no murmur Lungs:  clear to auscultation bilaterally, no wheezing, rhonchi or rales Abd: soft, nontender, no hepatomegalyOverweight Ext: 2+ BLEsignificant edema Skin: warm and dry Neuro: no focal abnormalities noted  EKG:  09/29/14-sinus rhythm, PVC, nonspecific T-wave changes. Heart rate 91. 09/09/13:  Sinus rhythm rate 84, poor R wave progression  ECHO 09/08/13:   - Left ventricle: The cavity size was moderately dilated. Systolic function was severely reduced. The estimated ejection fraction was in the range of 20% to 25%. Diffuse hypokinesis. Features are consistent with a pseudonormal left ventricular filling pattern, with concomitant abnormal relaxation  without increased filling pressure (grade 2 diastolic dysfunction). - Left atrium: The atrium was severely dilated. - Right ventricle: The cavity size was mildly dilated. Wall thickness was normal.  Echocardiogram 12/17/13: EF 20-25%-no change  TSH 0.74-normal, creatinine 1.28, potassium 4.6  ASSESSMENT AND PLAN:  1. Chronic systolic heart failure/cardiomyopathy-possible viral. Ejection fraction 20% on 09/08/13 and on repeat echo. Lisinopril and metoprolol were to goal dosage of 20 mg and 200 mg  respectively, however upon this visit, he was not taking either of these medications. His blood pressure is quite elevated. Likely hypertensive cardiomyopathy.. Previously he has been compliant with his medications. Go ahead and restart at half dosing, lisinopril 10 mg and Toprol 100 mg. If after 1 week his blood pressures are reasonable, we will increase back to goal dosage of 20 g lisinopril and 200 mg Toprol.  Encouraged use. During prior hospitalization, blood pressure was quite low at the end of visit. Also, I have instructed him to take an extra Lasix if his weight increases 3-5 pounds. Expressed the importance of medication compliance, salt restriction, fluid restriction. Basic metabolic profile reassuring on 11/03/13. Ultimately, we discussed the need for diagnostic heart catheterization to exclude coronary artery disease. He is going to check with his wife regarding his insurance. He will call us back. Risks and benefits of procedure including stroke, heart attack, death were discussed. We will proceed with radial artery approach once he is okay with insurance. 2. Morbid Obesity-encourage weight loss. Recent increased again of 20 pounds. 3. Getting back to exercising/wrestling but injured his shoulder.  If he is experiencing any symptoms, slow down, stop. Fatigue has been his main issue. Broad the possibility of sleep apnea. We may wish to ponder this in the future. 4. 89-month followup  Signed, Donato Schultz, MD [facility]  04/05/2015 8:52 AM

## 2015-04-22 ENCOUNTER — Telehealth: Payer: Self-pay | Admitting: Cardiology

## 2015-04-22 NOTE — Telephone Encounter (Signed)
New message     Pt was to call with blood pressure reading from today Blood pressure was 165/97

## 2015-04-22 NOTE — Telephone Encounter (Signed)
Patient said that Dr. Catalina Pizza him to take his blood pressure in 2 weeks and call it into the office.  Has taken blood pressure once, today and did not take is heart rate.  B/P today is: 165/97.  Is taking medications as prescribed Lisinopril 10 mg a day and Metoprolol 100 mg daily.  Reviewing Dr. Anne Fu office visit the goal is to increase his medications to Lisinopril 20 mg and Metoprolol 200 mg.  Routed to [facility] and Dr. Anne Fu to see if they want to change the dosage of his medications at this time

## 2015-04-26 ENCOUNTER — Other Ambulatory Visit: Payer: Self-pay

## 2015-04-26 MED ORDER — METOPROLOL SUCCINATE ER 200 MG PO TB24: 200.0000 mg | ORAL_TABLET | Freq: Every day | ORAL | Status: DC

## 2015-04-26 MED ORDER — LISINOPRIL 20 MG PO TABS: 20.0000 mg | ORAL_TABLET | Freq: Every day | ORAL | Status: DC

## 2015-04-26 NOTE — Telephone Encounter (Signed)
Left message to call back to discuss medication changes

## 2015-04-26 NOTE — Telephone Encounter (Signed)
Increase his medications to Lisinopril 20 mg and Metoprolol 200 mg. Thanks Donato Schultz, MD

## 2015-04-26 NOTE — Telephone Encounter (Signed)
Prescriptions changed to Lisinopril 20 mg daily and Metoprolol 200 mg daily  Called and informed patient

## 2015-04-26 NOTE — Telephone Encounter (Signed)
Pt aware of new orders to increase Lisinopril to 20 mg and Metoprolol to 200 mg. RX had already been sent into pharmacy earlier today.

## 2015-09-09 ENCOUNTER — Other Ambulatory Visit: Payer: Self-pay | Admitting: *Deleted

## 2015-09-09 MED ORDER — POTASSIUM CHLORIDE ER 20 MEQ PO TBCR: 40.0000 meq | EXTENDED_RELEASE_TABLET | Freq: Every day | ORAL | Status: DC

## 2015-12-17 ENCOUNTER — Telehealth: Payer: Self-pay | Admitting: Cardiology

## 2015-12-17 NOTE — Telephone Encounter (Signed)
New message     Calling to get update labs, and last ov notes.  Fax it to [phone]

## 2015-12-21 ENCOUNTER — Telehealth: Payer: Self-pay | Admitting: Cardiology

## 2015-12-21 NOTE — Telephone Encounter (Signed)
New message   Pharmacist needs to clarify orders form Dr.Nasher   [phone] fax number

## 2015-12-21 NOTE — Telephone Encounter (Signed)
Spoke with pharmacist and answered questions

## 2016-03-02 ENCOUNTER — Other Ambulatory Visit: Payer: Self-pay | Admitting: Cardiology

## 2016-03-02 ENCOUNTER — Other Ambulatory Visit: Payer: Self-pay | Admitting: *Deleted

## 2016-03-02 MED ORDER — FUROSEMIDE 40 MG PO TABS: ORAL_TABLET | ORAL | Status: DC

## 2016-04-07 ENCOUNTER — Ambulatory Visit (INDEPENDENT_AMBULATORY_CARE_PROVIDER_SITE_OTHER): Payer: BLUE CROSS/BLUE SHIELD | Admitting: Cardiology

## 2016-04-07 ENCOUNTER — Encounter: Payer: Self-pay | Admitting: Cardiology

## 2016-04-07 ENCOUNTER — Encounter (INDEPENDENT_AMBULATORY_CARE_PROVIDER_SITE_OTHER): Payer: Self-pay

## 2016-04-07 VITALS — BP 136/84 | HR 93 | Ht 71.0 in | Wt 306.6 lb

## 2016-04-07 DIAGNOSIS — I5022 Chronic systolic (congestive) heart failure: Secondary | ICD-10-CM

## 2016-04-07 DIAGNOSIS — I420 Dilated cardiomyopathy: Secondary | ICD-10-CM | POA: Diagnosis not present

## 2016-04-07 MED ORDER — LISINOPRIL 20 MG PO TABS: 20.0000 mg | ORAL_TABLET | Freq: Every day | ORAL | Status: DC

## 2016-04-07 MED ORDER — METOPROLOL SUCCINATE ER 200 MG PO TB24: 200.0000 mg | ORAL_TABLET | Freq: Every day | ORAL | Status: DC

## 2016-04-07 MED ORDER — POTASSIUM CHLORIDE ER 20 MEQ PO TBCR
40.0000 meq | EXTENDED_RELEASE_TABLET | Freq: Every day | ORAL | Status: AC
Start: 2016-04-07 — End: ?

## 2016-04-07 MED ORDER — FUROSEMIDE 40 MG PO TABS: ORAL_TABLET | ORAL | Status: AC

## 2016-04-07 NOTE — Progress Notes (Signed)
[address] [place], [place]  [address] Phone: [phone] Fax:  [phone]  Date:  04/07/2016   ID:  Noah Floyd, DOB 1965/10/24, MRN [id number]  PCP:  No PCP Per Patient   History of Present Illness: Noah Floyd is a 50 y.o. 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  Weight was 264 pounds on discharge. He was 282 on admission. Low-dose beta blocker was added on discharge, lisinopril 2.5 only because of hypotension. Lasix 40 mg by mouth daily.  10/10/13-tolerated his Toprol 25 mg and lisinopril 2.5 mg well. Blood pressure mildly elevated currently. We will go ahead and double these medications. No orthopnea, no PND. No edema. His weight is up slightly.  11/03/13-tolerate medications, taking them at night. He is now wrestling again.  12/01/13 - currently on metoprolol 100 and lisinopril 10. Heart rate 102.   03/31/14-main complaint is fatigue. 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  09/29/14-his weight has increased approximately 20 pounds. Overall Ace feeling well. No shortness of breath. He has noted lower extremity edema. He ran out of Lasix.  04/05/15 - month ago chest pain. BP was elevated. Had stopped his meds. No SOB. Weight up.   04/07/16-it has been quite a lot since he's come back to the clinic. Encouraged medication use. He states that he has been taking. He also states that he may start supplementing with essential oils. No syncopal, no chest pain. Some shortness of breath when going up stairs carrying objects. He has also had some abdominal/back pain as well.    Wt Readings from Last 3 Encounters:  04/07/16 306 lb 9.6 oz (139.073 kg)  04/05/15 311 lb 8 oz (141.295 kg)  09/29/14 293 lb  (132.904 kg)     Past Medical History  Diagnosis Date  . Acute systolic heart failure 09/07/2013    EF 20% on 09/08/13. 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 Mandible fracture surgery      Current Outpatient Prescriptions  Medication Sig Dispense Refill  . furosemide (LASIX) 40 MG tablet TAKE ONE TABLET BY MOUTH ONCE DAILY 90 tablet 3  . lisinopril (PRINIVIL,ZESTRIL) 20 MG tablet Take 1 tablet (20 mg total) by mouth daily. 30 tablet 11  . metoprolol (TOPROL-XL) 200 MG 24 hr tablet Take 1 tablet (200 mg total) by mouth daily. Take with or immediately following a meal. 30 tablet 11  . Potassium Chloride ER 20 MEQ TBCR Take 40 mEq by mouth daily. 60 tablet 11   No current facility-administered medications for this visit.    Allergies:    Allergies  Allergen Reactions  . Ibuprofen     headache    Social History:  The patient  reports that he has never smoked. He has never used smokeless tobacco. He reports that he does not drink alcohol.   ROS:  Please see the history of present illness.   Denies any syncope, bleeding, orthopnea, PND   PHYSICAL EXAM: VS:  BP 136/84 mmHg  Pulse 93  Ht  (1.803 m)  Wt 306 lb 9.6 oz (139.073 kg)  BMI 42.78 kg/m2 Well nourished, well developed, in no acute distress HEENT: normal Neck:  no JVD Cardiac:  normal S1, S2; RRR; no murmur Lungs:  clear to auscultation bilaterally, no wheezing, rhonchi or rales Abd: soft, nontender, no hepatomegalyOverweight Ext: 2+ BLEsignificant edema Skin: warm and dry Neuro: no focal abnormalities noted  EKG:  09/29/14-sinus rhythm, PVC, nonspecific T-wave changes. Heart rate 91. 09/09/13:  Sinus rhythm rate 84, poor R wave progression  ECHO 09/08/13:   - Left ventricle: The cavity size was moderately dilated. Systolic function was severely reduced. The estimated ejection fraction was in the range of 20% to 25%. Diffuse hypokinesis. Features are consistent  with a pseudonormal left ventricular filling pattern, with concomitant abnormal relaxation without increased filling pressure (grade 2 diastolic dysfunction). - Left atrium: The atrium was severely dilated. - Right ventricle: The cavity size was mildly dilated. Wall thickness was normal.  Echocardiogram 12/17/13: EF 20-25%-no change  TSH 0.74-normal, creatinine 1.28, potassium 4.6  ASSESSMENT AND PLAN:  1. Chronic systolic heart failure/cardiomyopathy-possible viral or hypertensive. Ejection fraction 20% on 09/08/13 and on repeat echo. Lisinopril and metoprolol were to goal dosage of 20 mg and 200 mg respectively. Encouraged use. During prior hospitalization, blood pressure was quite low at the end of visit. Also, I have instructed him to take an extra Lasix if his weight increases 3-5 pounds. Expressed the importance of medication compliance, salt restriction, fluid restriction. Basic metabolic profile reassuring. Ultimately, we discussed the need for diagnostic heart catheterization to exclude coronary artery disease. He decided not to proceed. He was worried about insurance. He also told me that he may start utilizing essential oils to help with his blood pressure. I would really like him to stay with his current therapy. He states he will call us if he starts to take them. Discussed the possibility of ventricular arrhythmias and use of ICD. He has not had any syncope during exercise. No palpitations. 2. Morbid Obesity-encourage weight loss. He was down to 240 pounds prior to his second marriage. He wants to get back to that position. He used to exercise in the park. 3. Getting back to exercise.  If he is experiencing any symptoms, slow down, stop. Fatigue has been his main issue. Brought up possibility of sleep apnea. We may wish to ponder this in the future. 4. 1 year followup  Signed, Donato Schultz, MD [facility]  04/07/2016 8:48 AM

## 2016-04-07 NOTE — Patient Instructions (Signed)

## 2016-10-04 ENCOUNTER — Ambulatory Visit: Payer: BLUE CROSS/BLUE SHIELD | Admitting: Cardiology

## 2016-10-25 ENCOUNTER — Encounter (HOSPITAL_COMMUNITY): Payer: Self-pay | Admitting: Emergency Medicine

## 2016-10-25 ENCOUNTER — Encounter (HOSPITAL_COMMUNITY)
Admission: EM | Disposition: A | Discharge status: Home / Self Care | Payer: Self-pay | Source: Home / Self Care | Attending: Emergency Medicine

## 2016-10-25 ENCOUNTER — Ambulatory Visit: Payer: BLUE CROSS/BLUE SHIELD | Admitting: Cardiology

## 2016-10-25 ENCOUNTER — Emergency Department (HOSPITAL_COMMUNITY): Payer: BLUE CROSS/BLUE SHIELD

## 2016-10-25 ENCOUNTER — Observation Stay (HOSPITAL_COMMUNITY)
Admission: EM | Admit: 2016-10-25 | Discharge: 2016-10-26 | Disposition: A | Discharge status: Home / Self Care | Payer: BLUE CROSS/BLUE SHIELD | Attending: Student in an Organized Health Care Education/Training Program | Admitting: Student in an Organized Health Care Education/Training Program

## 2016-10-25 DIAGNOSIS — I214 Non-ST elevation (NSTEMI) myocardial infarction: Principal | ICD-10-CM

## 2016-10-25 DIAGNOSIS — I110 Hypertensive heart disease with heart failure: Secondary | ICD-10-CM | POA: Diagnosis not present

## 2016-10-25 DIAGNOSIS — Z6837 Body mass index (BMI) 37.0-37.9, adult: Secondary | ICD-10-CM | POA: Diagnosis not present

## 2016-10-25 DIAGNOSIS — D72819 Decreased white blood cell count, unspecified: Secondary | ICD-10-CM | POA: Diagnosis not present

## 2016-10-25 DIAGNOSIS — Z79899 Other long term (current) drug therapy: Secondary | ICD-10-CM

## 2016-10-25 DIAGNOSIS — I428 Other cardiomyopathies: Secondary | ICD-10-CM | POA: Diagnosis not present

## 2016-10-25 DIAGNOSIS — I5022 Chronic systolic (congestive) heart failure: Secondary | ICD-10-CM | POA: Diagnosis present

## 2016-10-25 DIAGNOSIS — E669 Obesity, unspecified: Secondary | ICD-10-CM | POA: Diagnosis not present

## 2016-10-25 DIAGNOSIS — I10 Essential (primary) hypertension: Secondary | ICD-10-CM | POA: Diagnosis present

## 2016-10-25 DIAGNOSIS — N183 Chronic kidney disease, stage 3 unspecified: Secondary | ICD-10-CM | POA: Diagnosis present

## 2016-10-25 DIAGNOSIS — I509 Heart failure, unspecified: Secondary | ICD-10-CM

## 2016-10-25 DIAGNOSIS — Z8249 Family history of ischemic heart disease and other diseases of the circulatory system: Secondary | ICD-10-CM | POA: Insufficient documentation

## 2016-10-25 DIAGNOSIS — Z8669 Personal history of other diseases of the nervous system and sense organs: Secondary | ICD-10-CM | POA: Diagnosis not present

## 2016-10-25 DIAGNOSIS — Z886 Allergy status to analgesic agent status: Secondary | ICD-10-CM

## 2016-10-25 DIAGNOSIS — Z7982 Long term (current) use of aspirin: Secondary | ICD-10-CM

## 2016-10-25 DIAGNOSIS — I5023 Acute on chronic systolic (congestive) heart failure: Secondary | ICD-10-CM | POA: Diagnosis not present

## 2016-10-25 DIAGNOSIS — R079 Chest pain, unspecified: Secondary | ICD-10-CM | POA: Diagnosis present

## 2016-10-25 HISTORY — DX: Essential (primary) hypertension: I10

## 2016-10-25 HISTORY — PX: CARDIAC CATHETERIZATION: SHX172

## 2016-10-25 LAB — DIFFERENTIAL
BASOS ABS: 0.1 10*3/uL (ref 0.0–0.1)
BASOS PCT: 4 %
EOS ABS: 0.1 10*3/uL (ref 0.0–0.7)
EOS PCT: 4 %
LYMPHS PCT: 29 %
Lymphs Abs: 0.8 10*3/uL (ref 0.7–4.0)
Monocytes Absolute: 0.3 10*3/uL (ref 0.1–1.0)
Monocytes Relative: 11 %
Neutro Abs: 1.4 10*3/uL — ABNORMAL LOW (ref 1.7–7.7)
Neutrophils Relative %: 52 %

## 2016-10-25 LAB — COMPREHENSIVE METABOLIC PANEL
ALK PHOS: 63 U/L (ref 38–126)
ALT: 20 U/L (ref 17–63)
AST: 27 U/L (ref 15–41)
Albumin: 3.5 g/dL (ref 3.5–5.0)
Anion gap: 13 (ref 5–15)
BILIRUBIN TOTAL: 0.3 mg/dL (ref 0.3–1.2)
BUN: 10 mg/dL (ref 6–20)
CO2: 17 mmol/L — AB (ref 22–32)
CREATININE: 1.26 mg/dL — AB (ref 0.61–1.24)
Calcium: 9.2 mg/dL (ref 8.9–10.3)
Chloride: 110 mmol/L (ref 101–111)
Glucose, Bld: 111 mg/dL — ABNORMAL HIGH (ref 65–99)
Potassium: 4.1 mmol/L (ref 3.5–5.1)
SODIUM: 140 mmol/L (ref 135–145)
TOTAL PROTEIN: 6.8 g/dL (ref 6.5–8.1)

## 2016-10-25 LAB — I-STAT TROPONIN, ED: Troponin i, poc: 0.03 ng/mL (ref 0.00–0.08)

## 2016-10-25 LAB — CBC
HCT: 43.2 % (ref 39.0–52.0)
Hemoglobin: 14.5 g/dL (ref 13.0–17.0)
MCH: 27.3 pg (ref 26.0–34.0)
MCHC: 33.6 g/dL (ref 30.0–36.0)
MCV: 81.4 fL (ref 78.0–100.0)
PLATELETS: 259 10*3/uL (ref 150–400)
RBC: 5.31 MIL/uL (ref 4.22–5.81)
RDW: 13.4 % (ref 11.5–15.5)
WBC: 2.8 10*3/uL — ABNORMAL LOW (ref 4.0–10.5)

## 2016-10-25 LAB — TROPONIN I
TROPONIN I: 3.71 ng/mL — AB (ref <0.03)
Troponin I: 0.69 ng/mL (ref <0.03)
Troponin I: 5.93 ng/mL (ref <0.03)

## 2016-10-25 LAB — BRAIN NATRIURETIC PEPTIDE: B NATRIURETIC PEPTIDE 5: 1043.3 pg/mL — AB (ref 0.0–100.0)

## 2016-10-25 LAB — PROTIME-INR
INR: 1.09
PROTHROMBIN TIME: 14.2 s (ref 11.4–15.2)

## 2016-10-25 LAB — PLATELET INHIBITION P2Y12: PLATELET FUNCTION P2Y12: 197 [PRU] (ref 194–418)

## 2016-10-25 SURGERY — LEFT HEART CATH AND CORONARY ANGIOGRAPHY

## 2016-10-25 MED ORDER — ASPIRIN EC 81 MG PO TBEC
81.0000 mg | DELAYED_RELEASE_TABLET | Freq: Every day | ORAL | Status: DC
  Administered 2016-10-25 – 2016-10-26 (×2): 81 mg via ORAL
  Filled 2016-10-25 (×2): qty 1

## 2016-10-25 MED ORDER — SODIUM CHLORIDE 0.9% FLUSH: 3.0000 mL | INTRAVENOUS | Status: DC | PRN

## 2016-10-25 MED ORDER — SODIUM CHLORIDE 0.9% FLUSH: 3.0000 mL | Freq: Two times a day (BID) | INTRAVENOUS | Status: DC

## 2016-10-25 MED ORDER — ONDANSETRON HCL 4 MG/2ML IJ SOLN: 4.0000 mg | Freq: Four times a day (QID) | INTRAMUSCULAR | Status: DC | PRN

## 2016-10-25 MED ORDER — SODIUM CHLORIDE 0.9 % IV SOLN: 250.0000 mL | INTRAVENOUS | Status: DC | PRN

## 2016-10-25 MED ORDER — IOPAMIDOL (ISOVUE-370) INJECTION 76%
INTRAVENOUS | Status: DC | PRN
  Administered 2016-10-25: 70 mL via INTRA_ARTERIAL

## 2016-10-25 MED ORDER — METOPROLOL SUCCINATE ER 100 MG PO TB24
100.0000 mg | ORAL_TABLET | Freq: Two times a day (BID) | ORAL | Status: DC
  Administered 2016-10-25 – 2016-10-26 (×2): 100 mg via ORAL
  Filled 2016-10-25 (×2): qty 1

## 2016-10-25 MED ORDER — VERAPAMIL HCL 2.5 MG/ML IV SOLN
INTRAVENOUS | Status: AC
  Filled 2016-10-25: qty 2

## 2016-10-25 MED ORDER — HEPARIN SODIUM (PORCINE) 1000 UNIT/ML IJ SOLN
INTRAMUSCULAR | Status: DC | PRN
  Administered 2016-10-25: 6000 [IU] via INTRAVENOUS

## 2016-10-25 MED ORDER — ACETAMINOPHEN 325 MG PO TABS
650.0000 mg | ORAL_TABLET | ORAL | Status: DC | PRN
  Administered 2016-10-25: 650 mg via ORAL
  Filled 2016-10-25: qty 2

## 2016-10-25 MED ORDER — LIDOCAINE HCL (PF) 1 % IJ SOLN
INTRAMUSCULAR | Status: AC
  Filled 2016-10-25: qty 30

## 2016-10-25 MED ORDER — VERAPAMIL HCL 2.5 MG/ML IV SOLN
INTRAVENOUS | Status: DC | PRN
  Administered 2016-10-25: 10 mL via INTRA_ARTERIAL

## 2016-10-25 MED ORDER — HEPARIN (PORCINE) IN NACL 100-0.45 UNIT/ML-% IJ SOLN
1450.0000 [IU]/h | INTRAMUSCULAR | Status: DC
  Administered 2016-10-25: 1450 [IU]/h via INTRAVENOUS
  Filled 2016-10-25: qty 250

## 2016-10-25 MED ORDER — NITROGLYCERIN 0.4 MG SL SUBL
0.4000 mg | SUBLINGUAL_TABLET | SUBLINGUAL | Status: DC | PRN
  Administered 2016-10-25: 0.4 mg via SUBLINGUAL
  Filled 2016-10-25: qty 1

## 2016-10-25 MED ORDER — DIAZEPAM 5 MG PO TABS: 5.0000 mg | ORAL_TABLET | Freq: Four times a day (QID) | ORAL | Status: DC | PRN

## 2016-10-25 MED ORDER — ENOXAPARIN SODIUM 40 MG/0.4ML ~~LOC~~ SOLN
40.0000 mg | SUBCUTANEOUS | Status: DC
  Administered 2016-10-25: 40 mg via SUBCUTANEOUS
  Filled 2016-10-25: qty 0.4

## 2016-10-25 MED ORDER — SODIUM CHLORIDE 0.9% FLUSH
3.0000 mL | Freq: Two times a day (BID) | INTRAVENOUS | Status: DC
  Administered 2016-10-25: 3 mL via INTRAVENOUS

## 2016-10-25 MED ORDER — HEPARIN SODIUM (PORCINE) 1000 UNIT/ML IJ SOLN
INTRAMUSCULAR | Status: AC
  Filled 2016-10-25: qty 1

## 2016-10-25 MED ORDER — ACETAMINOPHEN 325 MG PO TABS
650.0000 mg | ORAL_TABLET | ORAL | Status: DC | PRN
  Administered 2016-10-26: 650 mg via ORAL
  Filled 2016-10-25: qty 2

## 2016-10-25 MED ORDER — DICLOFENAC SODIUM 1 % TD GEL
2.0000 g | Freq: Four times a day (QID) | TRANSDERMAL | Status: DC
  Administered 2016-10-25 – 2016-10-26 (×2): 2 g via TOPICAL
  Filled 2016-10-25: qty 100

## 2016-10-25 MED ORDER — LIDOCAINE HCL (PF) 1 % IJ SOLN
INTRAMUSCULAR | Status: DC | PRN
  Administered 2016-10-25: 2 mL via INTRADERMAL

## 2016-10-25 MED ORDER — MIDAZOLAM HCL 2 MG/2ML IJ SOLN
INTRAMUSCULAR | Status: DC | PRN
  Administered 2016-10-25: 2 mg via INTRAVENOUS

## 2016-10-25 MED ORDER — ATORVASTATIN CALCIUM 80 MG PO TABS: 80.0000 mg | ORAL_TABLET | Freq: Every day | ORAL | Status: DC

## 2016-10-25 MED ORDER — ASPIRIN 81 MG PO CHEW
162.0000 mg | CHEWABLE_TABLET | Freq: Once | ORAL | Status: AC
  Administered 2016-10-25: 162 mg via ORAL
  Filled 2016-10-25: qty 2

## 2016-10-25 MED ORDER — IOPAMIDOL (ISOVUE-370) INJECTION 76%
INTRAVENOUS | Status: AC
  Filled 2016-10-25: qty 100

## 2016-10-25 MED ORDER — FENTANYL CITRATE (PF) 100 MCG/2ML IJ SOLN
INTRAMUSCULAR | Status: AC
  Filled 2016-10-25: qty 2

## 2016-10-25 MED ORDER — LISINOPRIL 20 MG PO TABS
20.0000 mg | ORAL_TABLET | Freq: Every day | ORAL | Status: DC
  Administered 2016-10-26: 20 mg via ORAL
  Filled 2016-10-25: qty 1

## 2016-10-25 MED ORDER — MIDAZOLAM HCL 2 MG/2ML IJ SOLN
INTRAMUSCULAR | Status: AC
  Filled 2016-10-25: qty 2

## 2016-10-25 MED ORDER — FUROSEMIDE 10 MG/ML IJ SOLN
40.0000 mg | Freq: Once | INTRAMUSCULAR | Status: AC
  Administered 2016-10-25: 40 mg via INTRAVENOUS
  Filled 2016-10-25: qty 4

## 2016-10-25 MED ORDER — FENTANYL CITRATE (PF) 100 MCG/2ML IJ SOLN
INTRAMUSCULAR | Status: DC | PRN
  Administered 2016-10-25: 50 ug via INTRAVENOUS

## 2016-10-25 MED ORDER — HEPARIN (PORCINE) IN NACL 2-0.9 UNIT/ML-% IJ SOLN
INTRAMUSCULAR | Status: AC
  Filled 2016-10-25: qty 1000

## 2016-10-25 MED ORDER — HEPARIN (PORCINE) IN NACL 2-0.9 UNIT/ML-% IJ SOLN
INTRAMUSCULAR | Status: DC | PRN
  Administered 2016-10-25: 1000 mL

## 2016-10-25 MED ORDER — ENOXAPARIN SODIUM 40 MG/0.4ML ~~LOC~~ SOLN
40.0000 mg | SUBCUTANEOUS | Status: DC
  Administered 2016-10-26: 40 mg via SUBCUTANEOUS
  Filled 2016-10-25: qty 0.4

## 2016-10-25 MED ORDER — SODIUM CHLORIDE 0.9 % IV SOLN
INTRAVENOUS | Status: DC
  Administered 2016-10-25: 23:00:00 via INTRAVENOUS

## 2016-10-25 MED ORDER — HEPARIN BOLUS VIA INFUSION
2000.0000 [IU] | Freq: Once | INTRAVENOUS | Status: AC
  Administered 2016-10-25: 2000 [IU] via INTRAVENOUS
  Filled 2016-10-25: qty 2000

## 2016-10-25 SURGICAL SUPPLY — 12 items
CATH INFINITI 5FR ANG PIGTAIL (CATHETERS) ×3 IMPLANT
CATH OPTITORQUE TIG 4.0 5F (CATHETERS) ×3 IMPLANT
DEVICE RAD COMP TR BAND LRG (VASCULAR PRODUCTS) ×3 IMPLANT
GLIDESHEATH SLEND A-KIT 6F 22G (SHEATH) ×3 IMPLANT
GLIDESHEATH SLEND SS 6F .021 (SHEATH) ×3 IMPLANT
GUIDEWIRE INQWIRE 1.5J.035X260 (WIRE) ×1 IMPLANT
INQWIRE 1.5J .035X260CM (WIRE) ×3
KIT HEART LEFT (KITS) ×3 IMPLANT
PACK CARDIAC CATHETERIZATION (CUSTOM PROCEDURE TRAY) ×3 IMPLANT
SYR MEDRAD MARK V 150ML (SYRINGE) ×3 IMPLANT
TRANSDUCER W/STOPCOCK (MISCELLANEOUS) ×3 IMPLANT
TUBING CIL FLEX 10 FLL-RA (TUBING) ×3 IMPLANT

## 2016-10-25 NOTE — ED Notes (Signed)
Returned from  X-ray

## 2016-10-25 NOTE — ED Provider Notes (Signed)
[facility]-EMER[facility]ENCY DEPT Provider Note   CSN: [id number] Arrival date & time: 10/25/16  7829     History   Chief Complaint Chief Complaint  Patient presents with  . Chest Pain  . Shortness of Breath    HPI Noah Floyd is a 51 y.o. male.  HPI    Noah Floyd is a 51 y.o. male, with a history of systolic heart failure, presenting to the ED with chest pain that began suddenly at about 5:15 am this morning when he was dropping his wife off at work. Chest pain "feels like a knife stabbing," began in the left chest and has since moved to the central chest. Intensity varies between a 3-6/10 and radiates to the right jaw. Endorses shortness of breath and intermittent nausea, as well as increased peripheral edema this morning.   Denies vomiting, diarrhea, fever/chills, abdominal pain, recent illness, or any other complaints.   Cardiologist: Anne Fu Had an appointment today, but got postponed until Feb 1 due to Dr. Anne Fu having an emergency come up.    Past Medical History:  Diagnosis Date  . Acute systolic heart failure 09/07/2013   EF 20% on 09/08/13. 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 CHF exacerbation (HCC) 10/25/2016  . Chronic systolic heart failure (HCC) 09/07/2013  . Obesity 09/07/2013  . Renal insufficiency 09/07/2013    Past Surgical History:  Procedure Laterality Date  . MANDIBLE FRACTURE SURGERY         Home Medications    Prior to Admission medications   Medication Sig Start Date End Date Taking? Authorizing Provider  furosemide (LASIX) 40 MG tablet TAKE ONE TABLET BY MOUTH ONCE DAILY 04/07/16  Yes Jake Bathe, MD  lisinopril (PRINIVIL,ZESTRIL) 20 MG tablet Take 1 tablet (20 mg total) by mouth daily. 04/07/16  Yes Jake Bathe, MD  Potassium Chloride ER 20 MEQ TBCR Take 40 mEq by mouth daily. 04/07/16  Yes Jake Bathe, MD  metoprolol (TOPROL-XL) 200 MG 24 hr tablet Take 1 tablet (200 mg total) by mouth daily.  Take with or immediately following a meal. Patient not taking: Reported on 10/25/2016 04/07/16   Jake Bathe, MD    Family History Family History  Problem Relation Age of Onset  . Heart disease Mother   . Diabetes Mother   . Heart disease Maternal Grandmother   . Diabetes Maternal Grandmother     Social History Social History  Substance Use Topics  . Smoking status: Never Smoker  . Smokeless tobacco: Never Used  . Alcohol use No     Allergies   Ibuprofen   Review of Systems Review of Systems  Constitutional: Negative for chills and fever.  Respiratory: Positive for shortness of breath.   Cardiovascular: Positive for chest pain.  Gastrointestinal: Positive for nausea. Negative for abdominal pain and vomiting.  Neurological: Negative for dizziness, weakness, light-headedness and numbness.  All other systems reviewed and are negative.    Physical Exam Updated Vital Signs BP 152/83   Pulse 91   Temp 97.9 F (36.6 C) (Axillary)   Resp 26   Ht 6' (1.829 m)   Wt 124.7 kg   SpO2 90%   BMI 37.30 kg/m   Physical Exam  Constitutional: He appears well-developed and well-nourished. No distress.  HENT:  Head: Normocephalic and atraumatic.  Eyes: Conjunctivae are normal.  Neck: Neck supple.  No noted JVD, but exam ability reduced due to patient's obesity.  Cardiovascular:  Normal heart sounds and intact distal pulses.  An irregular rhythm present. Frequent extrasystoles are present. Bradycardia present.   Pulmonary/Chest: Tachypnea noted. He has rales in the right lower field and the left lower field.  Increased work of breathing, especially with movement or exertion. Mild to moderate orthopnea present.  Abdominal: Soft. There is no tenderness. There is no guarding.  Musculoskeletal: He exhibits edema.  Bilateral, nonpitting LE edema.  Lymphadenopathy:    He has no cervical adenopathy.  Neurological: He is alert.  Skin: Skin is warm and dry. He is not diaphoretic.    Psychiatric: He has a normal mood and affect. His behavior is normal.  Nursing note and vitals reviewed.    ED Treatments / Results  Labs (all labs ordered are listed, but only abnormal results are displayed) Labs Reviewed  CBC - Abnormal; Notable for the following:       Result Value   WBC 2.8 (*)    All other components within normal limits  BRAIN NATRIURETIC PEPTIDE - Abnormal; Notable for the following:    B Natriuretic Peptide 1,043.3 (*)    All other components within normal limits  COMPREHENSIVE METABOLIC PANEL - Abnormal; Notable for the following:    CO2 17 (*)    Glucose, Bld 111 (*)    Creatinine, Ser 1.26 (*)    All other components within normal limits  DIFFERENTIAL  I-STAT TROPOININ, ED    EKG  EKG Interpretation  Date/Time:  Wednesday October 25 2016 06:21:56 EST Ventricular Rate:  96 PR Interval:    QRS Duration: 106 QT Interval:  382 QTC Calculation: 483 R Axis:   -48 Text Interpretation:  Sinus tachycardia Premature ventricular complexes LAD, consider left anterior fascicular block Probable anteroseptal infarct, old When compared with ECG of 09/09/2013, Premature ventricular complexes are now present Confirmed by [facility]  MD, DAVID ([id number]) on 10/25/2016 6:25:06 AM        Radiology Dg Chest 2 View  Result Date: 10/25/2016 CLINICAL DATA:  Chest pain . EXAM: CHEST  2 VIEW COMPARISON:  09/09/2013 . FINDINGS: Mediastinum and hilar structures normal. Prominent cardiomegaly with mild pulmonary vascular prominence. No focal infiltrate. Low lung volumes. Small right pleural effusion. No pneumothorax . IMPRESSION: 1.  Prominent cardiomegaly with mild pulmonary vascular prominence. 2. Low lung volumes. No focal infiltrate. Small right pleural effusion. Electronically Signed   By: Maisie Fus  Register   On: 10/25/2016 07:59    Procedures Procedures (including critical care time)  Medications Ordered in ED Medications  nitroGLYCERIN (NITROSTAT) SL tablet 0.4 mg  (0.4 mg Sublingual Given 10/25/16 0722)  furosemide (LASIX) injection 40 mg (40 mg Intravenous Given 10/25/16 0714)     Initial Impression / Assessment and Plan / ED Course  I have reviewed the triage vital signs and the nursing notes.  Pertinent labs & imaging results that were available during my care of the patient were reviewed by me and considered in my medical decision making (see chart for details).      She presents with chest pain and shortness of breath that began suddenly this morning. Suspect acute CHF exacerbation. Elevated BNP and minor pulmonary edema noted on CXR.  Upon reassessment, patient states is now pain-free and his shortness of breath has improved. Admission for observation and further management. Patient agrees to the plan. 8:58 AM Spoke with Heywood Iles, IM resident, who agreed to admit the patient to telemetry observation under attending Erlinda Hong.  Findings and plan of care discussed with Westley Gambles  Long, MD. Dr. Jacqulyn Bath personally evaluated and examined this patient.   Vitals:   10/25/16 0623 10/25/16 0630 10/25/16 0645  BP: 163/94 143/82 152/83  Pulse: 94 (!) 43 91  Resp: (!) 30 (!) 28 26  Temp: 97.9 F (36.6 C)    TempSrc: Axillary    SpO2: 92% 92% 90%  Weight: 124.7 kg    Height: 6' (1.829 m)     Vitals:   10/25/16 0630 10/25/16 0645 10/25/16 0715 10/25/16 0725  BP: 143/82 152/83 142/71 150/67  Pulse: (!) 43 91 89   Resp: (!) 28 26 24    Temp:      TempSrc:      SpO2: 92% 90%    Weight:      Height:          Final Clinical Impressions(s) / ED Diagnoses   Final diagnoses:  Acute on chronic systolic congestive heart failure [facility])    New Prescriptions New Prescriptions   No medications on file     Anselm Pancoast, PA-C 10/25/16 0902    Maia Plan, MD 10/25/16 [phone]

## 2016-10-25 NOTE — ED Notes (Signed)
Called X-ray to let them know the pt is ready.

## 2016-10-25 NOTE — ED Notes (Signed)
Cardiology in for pt eval

## 2016-10-25 NOTE — H&P (Signed)
Date: 10/25/2016               Patient Name:  Noah Floyd MRN: [id number]  DOB: Jan 07, 1966 Age / Sex: 51 y.o., male   PCP: No Pcp Per Patient         Medical Service: Internal Medicine Teaching Service         Attending Physician: Dr. Tyson Alias, MD    First Contact: Dr. Laural Benes Pager: [phone]  Second Contact: Dr. 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 Around 5:15 AM, he was driving when he felt sudden onset of sharp chest pain that radiated from the left side to the mid sternum that was associated with nausea, shortness of breath. His pain persisted, varying from 3-6/10 in intensity, so he presented to the hospital for further evaluation. He has never experienced this pain before and has just resumed an exercise regimen. He was diagnosed with chronic congestive heart failure in 2014 when he was was found to have reduced ejection fraction and felt viral-related as he had community acquired pneumonia. He follows with Dr. Anne Fu every 6 months and has never undergone an ischemic workup due to insurance. He takes oral furosemide 40 mg daily but does not check his weights daily. His mother had "an enlarged heart" and died at age 48. Aside from hypertension and chronic headaches for which he medicates with 2 packets of BC powder/day, he denies any dyspnea on exertion, leg swelling, cough, changes in appetite, abdominal pain, vomiting, prior MI, prior lung disease, diabetes. He works as an Statistician and does not smoke, use alcohol though has smoked [place] cigarettes (marijuna) occasionally in  the past.  In the emergency department, review of his initial labwork was notable for negative troponin, BNP 1043.53, Na 140, K 4.1, Cl 110, bicarb 17, BUN 18, Crt 1.3, mildly elevated anion gap 13, leukopenia 2.8 [absolute neutrophil count 1.4], Hb 14.5, Hct 43.2, platelets 259. He was given furosemide 40mg  IV x 1 and sublingual nitroglycerin which resolved his chest pain. Repeat troponin was elevated at 0.67, and Heart Failure recommended ischemic workup.  Meds:  Current Meds  Medication Sig  . furosemide (LASIX) 40 MG tablet TAKE ONE TABLET BY MOUTH ONCE DAILY  . lisinopril (PRINIVIL,ZESTRIL) 20 MG tablet Take 1 tablet (20 mg total) by mouth daily.  . Potassium Chloride ER 20 MEQ TBCR Take 40 mEq by mouth daily.     Allergies: Allergies as of 10/25/2016 - Review Complete 10/25/2016  Allergen Reaction Noted  . Ibuprofen  10/10/2013   Past Medical History:  Diagnosis Date  . Acute systolic heart failure 09/07/2013   EF 20% on 09/08/13. 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 Hypertension 10/25/2016    Family History: As noted in the HPI.  Social History: As noted in the HPI.  Review of Systems: Dizziness associated with changes in vision from his glaucoma. No diarrhea, melena, hematochezia.    Physical Exam: Blood pressure 150/67, pulse 89, temperature 97.9 F (36.6 C), temperature source Axillary, resp. rate 24, height 6' (1.829 m), weight 275 lb (124.7 kg), SpO2 90 %. Physical Exam  Constitutional:  He is oriented to person, place, and time. No distress.  HENT:  Head: Normocephalic and atraumatic.  Mouth/Throat: No oropharyngeal exudate.  Eyes: Conjunctivae are normal. Pupils are equal, round, and reactive to light.  R pupil 6 mm in size, greater than L pupil 3 mm in size, which is baseline per patient from prior glaucoma surgery.  Neck: JVD (2 cm above the sternal notch) present.  Cardiovascular: Normal rate, regular rhythm and intact distal pulses.  Exam reveals no gallop and no  friction rub.   No murmur heard. Pulmonary/Chest: Effort normal. No respiratory distress.  Musculoskeletal: He exhibits edema (1-2+ pitting edema, R>L).  Neurological: He is alert and oriented to person, place, and time.  Skin: Skin is warm and dry. He is not diaphoretic.   EKG: I reviewed and compared with 04/07/16. Normal sinus rhythm with occasional PVCs Left axis deviation Intraventricular conduction delay  CXR: I reviewed and compared with 10/25/16. Interpretation limited by inspiratory effort. Cardiomegaly. Possible right-sided pleural effusion.  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  Chest pain: He is at high-risk given systolic dysfunction, longstanding hypertension. TIMI score 2 for UA/NSTEMI given elevated troponin and recent aspirin use though unclear if he has diabetes [all CBGs in system in low 100s] or hyperlipidemia. EKG without ST elevations. No pleuritic component to suggest pulmonary process which is also consistent with CXR findings. Hemodynamically stable at the moment. -Give ASA 325 mg and continue 81 mg daily -Monitor on telemetry -Check troponins x 3 -Repeat EKG in AM -Continue nitroglycerin sublingual prn for chest pain -Check lipid panel -Follow-up cardiac cath results  Acute on chronic heart failure with reduced ejection fraction: Suspect acute decompensation in the setting of cardiac process.  -Ischemic workup as noted above -Daily weights, strict ins and outs -Heart Failure following, appreciate recommendations   Leukopenia: WBC 2.1 on admission, with mildly reduced neutrophil count. -Check  HIV, HCV -Repeat CBC tomorrow  Hypertension: Resume home lisinopril 20 mg.  #FEN:  -Diet: Heart Healthy  #DVT prophylaxis: heparin gtt  #CODE STATUS: FULL CODE -Defer to wife April Alcorn [[phone]] if patients lacks decision-making capacity -Confirmed with patient on admission  Dispo: Admit patient to Observation with expected length of stay less than 2 midnights.  Signed: Beather Arbour, MD 10/25/2016, 9:56 AM  Pager: [phone]

## 2016-10-25 NOTE — ED Notes (Signed)
Pt states he is feeling much better. Denies pain or sob.

## 2016-10-25 NOTE — ED Notes (Signed)
Transported to cath lab with RN and EMT. No pain.

## 2016-10-25 NOTE — Consult Note (Signed)
Advanced Heart Failure Team Consult Note  Referring Physician: Dr. Oswaldo Done Primary Cardiologist:  Dr Anne Fu  Reason for Consultation: Acute on chronic CHF.  Chest pain.   HPI:    Noah Floyd is a 51 y.o. 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 Followed by Dr Anne Fu.  Pt has previously been considered for coronary angiography but has refused due to insurance purposes.   Presented to [facility] with sudden onset sharp chest pain while driving wife to work. Associated Nausea and SOB. Persisted until he got NTG and ASA in hospital, and then was instantly relieved. Pertinent labs on admission include troponin 0.69, K 4.1, Creatinine 1.26, BNP 1043.3.  States he has never had this type of chest pain before.  He has been working out and dieting to try and lose weight. Takes all medication as directed. Usually doesn't have any DOE. Did have SOB associated with his chest pain this am. He does not smoke, drink, or do drugs.   Currently unemployed. Lives at home with wife. Children are grown and live in [place].   Review of Systems: [y] = yes, [ ]  = no   General: Weight gain [ ] ; Weight loss [ ] ; Anorexia [ ] ; Fatigue [ ] ; Fever [ ] ; Chills [ ] ; Weakness [ ]   Cardiac: Chest pain/pressure [y]; Resting SOB [y]; Exertional SOB [ ] ; Orthopnea [ ] ; Pedal Edema [y]; Palpitations [ ] ; Syncope [ ] ; Presyncope [ ] ; Paroxysmal nocturnal dyspnea[ ]   Pulmonary: Cough [ ] ; Wheezing[ ] ; Hemoptysis[ ] ; Sputum [ ] ; Snoring [ ]   GI: Vomiting[ ] ; Dysphagia[ ] ; Melena[ ] ; Hematochezia [ ] ; Heartburn[ ] ; Abdominal pain [ ] ; Constipation [ ] ; Diarrhea [ ] ; BRBPR [ ]   GU: Hematuria[ ] ; Dysuria [ ] ; Nocturia[ ]   Vascular: Pain in legs with walking [ ] ; Pain in feet with lying flat [ ] ; Non-healing sores [ ] ; Stroke [ ] ; TIA [ ] ; Slurred speech [ ] ;  Neuro: Headaches[ ] ; Vertigo[ ] ; Seizures[ ] ; Paresthesias[ ] ;Blurred vision [ ] ; Diplopia [ ] ; Vision changes [ ]   Ortho/Skin:  Arthritis [y]; Joint pain [y]; Muscle pain [ ] ; Joint swelling [ ] ; Back Pain [ ] ; Rash [ ]   Psych: Depression[ ] ; Anxiety[ ]   Heme: Bleeding problems [ ] ; Clotting disorders [ ] ; Anemia [ ]   Endocrine: Diabetes [ ] ; Thyroid dysfunction[ ]   Home Medications Prior to Admission medications   Medication Sig Start Date End Date Taking? Authorizing Provider  furosemide (LASIX) 40 MG tablet TAKE ONE TABLET BY MOUTH ONCE DAILY 04/07/16  Yes Jake Bathe, MD  lisinopril (PRINIVIL,ZESTRIL) 20 MG tablet Take 1 tablet (20 mg total) by mouth daily. 04/07/16  Yes Jake Bathe, MD  Potassium Chloride ER 20 MEQ TBCR Take 40 mEq by mouth daily. 04/07/16  Yes Jake Bathe, MD  metoprolol (TOPROL-XL) 200 MG 24 hr tablet Take 1 tablet (200 mg total) by mouth daily. Take with or immediately following a meal. Patient not taking: Reported on 10/25/2016 04/07/16   Jake Bathe, MD    Past Medical History: Past Medical History:  Diagnosis Date  . Acute systolic heart failure 09/07/2013   EF 20% on 09/08/13. 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 Hypertension 10/25/2016    Past Surgical History: Past Surgical History:  Procedure Laterality Date  . MANDIBLE FRACTURE SURGERY      Family History: Family History  Problem Relation Age of Onset  . Heart disease  Mother   . Diabetes Mother   . Heart disease Maternal Grandmother   . Diabetes Maternal Grandmother     Social History: Social History   Social History  . Marital status: Married    Spouse name: N/A  . Number of children: N/A  . Years of education: N/A   Social History Main Topics  . Smoking status: Never Smoker  . Smokeless tobacco: Never Used  . Alcohol use No  . Drug use: Unknown  . Sexual activity: Not Asked   Other Topics Concern  . None   Social History Narrative  . None    Allergies:  Allergies  Allergen Reactions  . Ibuprofen     headache    Objective:    Vital Signs:   Temp:  [97.9 F (36.6 C)] 97.9 F (36.6 C) (01/31  0623) Pulse Rate:  [43-94] 89 (01/31 0715) Resp:  [24-30] 24 (01/31 0715) BP: (142-163)/(67-94) 150/67 (01/31 0725) SpO2:  [90 %-92 %] 90 % (01/31 0645) Weight:  [275 lb (124.7 kg)] 275 lb (124.7 kg) (01/31 0623)    Weight change: Filed Weights   10/25/16 0623  Weight: 275 lb (124.7 kg)    Intake/Output:   Intake/Output Summary (Last 24 hours) at 10/25/16 1145 Last data filed at 10/25/16 1107  Gross per 24 hour  Intake                0 ml  Output             2301 ml  Net            -2301 ml     Physical Exam: General:  Obese. NAD at rest.  HEENT: normal Neck: supple. JVP difficult but does not appear significantly elevated. Carotids 2+ bilat; no bruits. No thyromegaly or nodule noted.  Cor: PMI nondisplaced. RRR. No rubs, gallops or murmurs. Lungs: Distant. Mildly diminised Abdomen: soft, nontender, nondistended. No hepatosplenomegaly. No bruits or masses. Good bowel sounds. Extremities: no cyanosis, clubbing, rash, edema Neuro: alert & orientedx3, cranial nerves grossly intact. moves all 4 extremities w/o difficulty. Affect pleasant  Telemetry: Reviewed, NSR 80s  Labs: Basic Metabolic Panel:  Recent Labs Lab 10/25/16 0646  NA 140  K 4.1  CL 110  CO2 17*  GLUCOSE 111*  BUN 10  CREATININE 1.26*  CALCIUM 9.2    Liver Function Tests:  Recent Labs Lab 10/25/16 0646  AST 27  ALT 20  ALKPHOS 63  BILITOT 0.3  PROT 6.8  ALBUMIN 3.5   No results for input(s): LIPASE, AMYLASE in the last 168 hours. No results for input(s): AMMONIA in the last 168 hours.  CBC:  Recent Labs Lab 10/25/16 0732  WBC 2.8*  NEUTROABS 1.4*  HGB 14.5  HCT 43.2  MCV 81.4  PLT 259    Cardiac Enzymes:  Recent Labs Lab 10/25/16 1019  TROPONINI 0.69*    BNP: BNP (last 3 results)  Recent Labs  10/25/16 0647  BNP 1,043.3*    ProBNP (last 3 results) No results for input(s): PROBNP in the last 8760 hours.   CBG: No results for input(s): GLUCAP in the last 168  hours.  Coagulation Studies: No results for input(s): LABPROT, INR in the last 72 hours.  Other results: EKG: NSR with PVCs, No ST elevation  Imaging: Dg Chest 2 View  Result Date: 10/25/2016 CLINICAL DATA:  Chest pain . EXAM: CHEST  2 VIEW COMPARISON:  09/09/2013 . FINDINGS: Mediastinum and hilar structures normal. Prominent cardiomegaly with mild  pulmonary vascular prominence. No focal infiltrate. Low lung volumes. Small right pleural effusion. No pneumothorax . IMPRESSION: 1.  Prominent cardiomegaly with mild pulmonary vascular prominence. 2. Low lung volumes. No focal infiltrate. Small right pleural effusion. Electronically Signed   By: Maisie Fus  Register   On: 10/25/2016 07:59      Medications:     Current Medications: . aspirin  162 mg Oral Once  . aspirin EC  81 mg Oral Daily  . enoxaparin (LOVENOX) injection  40 mg Subcutaneous Q24H     Infusions:    Assessment/Plan   Adriel Kessen is a 51 y.o. male admitted with chest pain. Found to have elevated BNP and initial troponin with slight uptrend.  HF team consulted with A/C systolic CHF.   1. Chest pain -> ?NSTEMI - Picture concerning for ACS. No h/o of work up. Will send for LHC. Pt aware and agrees - Relieved with NTG and ASA.   - Troponin 0.69 follow q 6 hrs. - Start heparin gtt.  - Add statin.  2. Acute on chronic systolic CHF - Volume status mild elevated at most.  Weight has been stable on po meds.  - Will need repeat Echo.  Possible ICD consideration if EF remains depressed. - Will titrate meds as possible 3. HTN - Will titrate meds in setting of treating his HF.   Length of Stay: 0  Luane School  10/25/2016, 11:45 AM  Advanced Heart Failure Team Pager [phone] (M-F; 7a - 4p)  Please contact [facility] for night-coverage after hours (4p -7a ) and weekends on [email or web address]  Patient seen with PA, agree with the above note.  Patient has history of chronic systolic CHF with previous EF 45-40%.   He has never had an ischemic evaluation (was concerned that his insurance would not cover cath). He never had an MI in the past.  In terms of CHF, he has been symptomatically stable.  No significant dyspnea, weight has been coming down with increased exercise.  His BNP is high but he really does not look particularly volume overloaded on exam.    He actually came to the ER today because of about 2 hours of central substernal chest pain that started while he was driving. The chest pain persisted until he came to the ER and then resolved with NTG.  Initial troponin 0.69.  ECG with NSR, poor R wave progression (similar to the past).    I suspect that the main issue here is ACS/NSTEMI.  We will admit him and arrange for cardiac cath.  Given his pre-existing low EF, it is certainly possible that he has very extensive coronary disease.    Marca Ancona 10/25/2016 12:34 PM

## 2016-10-25 NOTE — ED Notes (Signed)
Patient denies pain and is resting comfortably. Aware of POC

## 2016-10-25 NOTE — ED Notes (Signed)
Pt aware he is added onto cath schedule today

## 2016-10-25 NOTE — ED Notes (Signed)
Informed admitting MD of pt's critical troponin. States CHF team will be in to see pt

## 2016-10-25 NOTE — Progress Notes (Signed)
Patient has tolerated air removal from TR band well. Report given to oncoming nurse.

## 2016-10-25 NOTE — ED Triage Notes (Addendum)
Pt in with c/o CP, cough and SOB since 0600. Pt states he was driving his wife to work, began to have sudden sharp L sided cp that radiated to right chest, also c/o R jaw pain. Pt endorses sob, nausea. Hx of CHF, states he takes Lasix, +1 swelling in BLE's. Sats 95% on RA.

## 2016-10-25 NOTE — Progress Notes (Signed)
Patient had 5 beats of Vtach, asymptomatic and feels fine.Vitals WNL. MD notified.

## 2016-10-25 NOTE — Progress Notes (Deleted)
Cardiology Office Note   Date:  10/25/2016   ID:  Noah Floyd, DOB 1965/10/11, MRN [id number]  PCP:  No PCP Per Patient  Cardiologist:  ***    No chief complaint on file.     History of Present Illness: Noah Floyd is a 50 y.o. male who presents for NICM    Past Medical History:  Diagnosis Date  . Acute systolic heart failure 09/07/2013   EF 20% on 09/08/13. 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 Hypertension 10/25/2016    Past Surgical History:  Procedure Laterality Date  . MANDIBLE FRACTURE SURGERY       No current facility-administered medications for this visit.    No current outpatient prescriptions on file.   Facility-Administered Medications Ordered in Other Visits  Medication Dose Route Frequency Provider Last Rate Last Dose  . 0.9 %  sodium chloride infusion  250 mL Intravenous PRN Rushil Patel V, MD      . 0.9 %  sodium chloride infusion  250 mL Intravenous PRN Graciella Freer, PA-C      . Melene Muller ON 10/26/2016] 0.9 %  sodium chloride infusion   Intravenous Continuous Mariam Dollar Tillery, PA-C      . 0.9 %  sodium chloride infusion  250 mL Intravenous PRN Lennette Bihari, MD      . 0.9 %  sodium chloride infusion  250 mL Intravenous PRN Lennette Bihari, MD      . acetaminophen (TYLENOL) tablet 650 mg  650 mg Oral Q4H PRN Beather Arbour, MD      . acetaminophen (TYLENOL) tablet 650 mg  650 mg Oral Q4H PRN Lennette Bihari, MD      . aspirin EC tablet 81 mg  81 mg Oral Daily Rushil Terrilee Croak, MD   81 mg at 10/25/16 1042  . atorvastatin (LIPITOR) tablet 80 mg  80 mg Oral q1800 Laurey Morale, MD      . diazepam (VALIUM) tablet 5 mg  5 mg Oral Q6H PRN Lennette Bihari, MD      . Melene Muller ON 10/26/2016] enoxaparin (LOVENOX) injection 40 mg  40 mg Subcutaneous Q24H Lennette Bihari, MD      . heparin ADULT infusion 100 units/mL (25000 units/246mL sodium chloride 0.45%)  1,450 Units/hr Intravenous Continuous Marquita Palms, RPH 14.5 mL/hr at 10/25/16 1253 1,450 Units/hr at 10/25/16  1253  . lisinopril (PRINIVIL,ZESTRIL) tablet 20 mg  20 mg Oral Daily Rushil Patel V, MD      . metoprolol succinate (TOPROL-XL) 24 hr tablet 100 mg  100 mg Oral BID Laurey Morale, MD      . nitroGLYCERIN (NITROSTAT) SL tablet 0.4 mg  0.4 mg Sublingual Q5 min PRN Anselm Pancoast, PA-C   0.4 mg at 10/25/16 4098  . ondansetron (ZOFRAN) injection 4 mg  4 mg Intravenous Q6H PRN Rushil Terrilee Croak, MD      . ondansetron (ZOFRAN) injection 4 mg  4 mg Intravenous Q6H PRN Lennette Bihari, MD      . sodium chloride flush (NS) 0.9 % injection 3 mL  3 mL Intravenous Q12H Rushil Patel V, MD      . sodium chloride flush (NS) 0.9 % injection 3 mL  3 mL Intravenous PRN Rushil Patel V, MD      . sodium chloride flush (NS) 0.9 % injection 3 mL  3 mL Intravenous Q12H Mariam Dollar Tillery, PA-C      . sodium chloride flush (NS) 0.9 %  injection 3 mL  3 mL Intravenous PRN Mariam Dollar Tillery, PA-C      . sodium chloride flush (NS) 0.9 % injection 3 mL  3 mL Intravenous Q12H Lennette Bihari, MD      . sodium chloride flush (NS) 0.9 % injection 3 mL  3 mL Intravenous PRN Lennette Bihari, MD      . sodium chloride flush (NS) 0.9 % injection 3 mL  3 mL Intravenous Q12H Lennette Bihari, MD      . sodium chloride flush (NS) 0.9 % injection 3 mL  3 mL Intravenous PRN Lennette Bihari, MD        Allergies:   Ibuprofen    Social History:  The patient  reports that he has never smoked. He has never used smokeless tobacco. He reports that he does not drink alcohol.   Family History:  The patient's ***family history includes Diabetes in his maternal grandmother and mother; Heart disease in his maternal grandmother and mother.    ROS:  General:no colds or fevers, no weight changes Skin:no rashes or ulcers HEENT:no blurred vision, no congestion CV:see HPI PUL:see HPI GI:no diarrhea constipation or melena, no indigestion GU:no hematuria, no dysuria MS:no joint pain, no claudication Neuro:no syncope, no lightheadedness Endo:no  diabetes, no thyroid disease Wt Readings from Last 3 Encounters:  10/25/16 274 lb 14.6 oz (124.7 kg)  04/07/16 (!) 306 lb 9.6 oz (139.1 kg)  04/05/15 (!) 311 lb 8 oz (141.3 kg)     PHYSICAL EXAM: VS:  There were no vitals taken for this visit. , BMI There is no height or weight on file to calculate BMI. General:Pleasant affect, NAD Skin:Warm and dry, brisk capillary refill HEENT:normocephalic, sclera clear, mucus membranes moist Neck:supple, no JVD, no bruits  Heart:S1S2 RRR without murmur, gallup, rub or click Lungs:clear without rales, rhonchi, or wheezes YJW:LKHV, non tender, + BS, do not palpate liver spleen or masses Ext:no lower ext edema, 2+ pedal pulses, 2+ radial pulses Neuro:alert and oriented, MAE, follows commands, + facial symmetry    EKG:  EKG is ordered today. The ekg ordered today demonstrates ***   Recent Labs: 10/25/2016: ALT 20; B Natriuretic Peptide 1,043.3; BUN 10; Creatinine, Ser 1.26; Hemoglobin 14.5; Platelets 259; Potassium 4.1; Sodium 140    Lipid Panel No results found for: CHOL, TRIG, HDL, CHOLHDL, VLDL, LDLCALC, LDLDIRECT     Other studies Reviewed: Additional studies/ records that were reviewed today include: ***.   ASSESSMENT AND PLAN:  1.  ***   Current medicines are reviewed with the patient today.  The patient Has no concerns regarding medicines.  The following changes have been made:  See above Labs/ tests ordered today include:see above  Disposition:   FU:  see above  Signed, Nada Boozer, NP  10/25/2016 6:07 PM    [facility] [address] [place], [place], [place]  [address]/ [address] [place], [place] Phone: [phone]; Fax: [phone]  [phone]

## 2016-10-25 NOTE — Interval H&P Note (Signed)
Cath Lab Visit (complete for each Cath Lab visit)  Clinical Evaluation Leading to the Procedure:   ACS: Yes.    Non-ACS:    Anginal Classification: CCS III  Anti-ischemic medical therapy: No Therapy  Non-Invasive Test Results: No non-invasive testing performed  Prior CABG: No previous CABG      History and Physical Interval Note:  10/25/2016 4:24 PM  Noah Floyd  has presented today for surgery, with the diagnosis of n stemi  The various methods of treatment have been discussed with the patient and family. After consideration of risks, benefits and other options for treatment, the patient has consented to  Procedure(s): Left Heart Cath and Coronary Angiography (N/A) as a surgical intervention .  The patient's history has been reviewed, patient examined, no change in status, stable for surgery.  I have reviewed the patient's chart and labs.  Questions were answered to the patient's satisfaction.     [name]

## 2016-10-25 NOTE — H&P (View-Only) (Signed)
  Advanced Heart Failure Team Consult Note  Referring Physician: Dr. Vincent Primary Cardiologist:  Dr Skains  Reason for Consultation: Acute on chronic CHF.  Chest pain.   HPI:    Noah Floyd is a 51 y.o. 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 Followed by Dr Skains.  Pt has previously been considered for coronary angiography but has refused due to insurance purposes.   Presented to [facility] with sudden onset sharp chest pain while driving wife to work. Associated Nausea and SOB. Persisted until he got NTG and ASA in hospital, and then was instantly relieved. Pertinent labs on admission include troponin 0.69, K 4.1, Creatinine 1.26, BNP 1043.3.  States he has never had this type of chest pain before.  He has been working out and dieting to try and lose weight. Takes all medication as directed. Usually doesn't have any DOE. Did have SOB associated with his chest pain this am. He does not smoke, drink, or do drugs.   Currently unemployed. Lives at home with wife. Children are grown and live in [place].   Review of Systems: [y] = yes, [ ] = no   General: Weight gain [ ]; Weight loss [ ]; Anorexia [ ]; Fatigue [ ]; Fever [ ]; Chills [ ]; Weakness [ ]  Cardiac: Chest pain/pressure [y]; Resting SOB [y]; Exertional SOB [ ]; Orthopnea [ ]; Pedal Edema [y]; Palpitations [ ]; Syncope [ ]; Presyncope [ ]; Paroxysmal nocturnal dyspnea[ ]  Pulmonary: Cough [ ]; Wheezing[ ]; Hemoptysis[ ]; Sputum [ ]; Snoring [ ]  GI: Vomiting[ ]; Dysphagia[ ]; Melena[ ]; Hematochezia [ ]; Heartburn[ ]; Abdominal pain [ ]; Constipation [ ]; Diarrhea [ ]; BRBPR [ ]  GU: Hematuria[ ]; Dysuria [ ]; Nocturia[ ]  Vascular: Pain in legs with walking [ ]; Pain in feet with lying flat [ ]; Non-healing sores [ ]; Stroke [ ]; TIA [ ]; Slurred speech [ ];  Neuro: Headaches[ ]; Vertigo[ ]; Seizures[ ]; Paresthesias[ ];Blurred vision [ ]; Diplopia [ ]; Vision changes [ ]  Ortho/Skin:  Arthritis [y]; Joint pain [y]; Muscle pain [ ]; Joint swelling [ ]; Back Pain [ ]; Rash [ ]  Psych: Depression[ ]; Anxiety[ ]  Heme: Bleeding problems [ ]; Clotting disorders [ ]; Anemia [ ]  Endocrine: Diabetes [ ]; Thyroid dysfunction[ ]  Home Medications Prior to Admission medications   Medication Sig Start Date End Date Taking? Authorizing Provider  furosemide (LASIX) 40 MG tablet TAKE ONE TABLET BY MOUTH ONCE DAILY 04/07/16  Yes Mark C Skains, MD  lisinopril (PRINIVIL,ZESTRIL) 20 MG tablet Take 1 tablet (20 mg total) by mouth daily. 04/07/16  Yes Mark C Skains, MD  Potassium Chloride ER 20 MEQ TBCR Take 40 mEq by mouth daily. 04/07/16  Yes Mark C Skains, MD  metoprolol (TOPROL-XL) 200 MG 24 hr tablet Take 1 tablet (200 mg total) by mouth daily. Take with or immediately following a meal. Patient not taking: Reported on 10/25/2016 04/07/16   Mark C Skains, MD    Past Medical History: Past Medical History:  Diagnosis Date  . Acute systolic heart failure 09/07/2013   EF 20% on 09/08/13. 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 Hypertension 10/25/2016    Past Surgical History: Past Surgical History:  Procedure Laterality Date  . MANDIBLE FRACTURE SURGERY      Family History: Family History  Problem Relation Age of Onset  . Heart disease   Mother   . Diabetes Mother   . Heart disease Maternal Grandmother   . Diabetes Maternal Grandmother     Social History: Social History   Social History  . Marital status: Married    Spouse name: N/A  . Number of children: N/A  . Years of education: N/A   Social History Main Topics  . Smoking status: Never Smoker  . Smokeless tobacco: Never Used  . Alcohol use No  . Drug use: Unknown  . Sexual activity: Not Asked   Other Topics Concern  . None   Social History Narrative  . None    Allergies:  Allergies  Allergen Reactions  . Ibuprofen     headache    Objective:    Vital Signs:   Temp:  [97.9 F (36.6 C)] 97.9 F (36.6 C) (01/31  0623) Pulse Rate:  [43-94] 89 (01/31 0715) Resp:  [24-30] 24 (01/31 0715) BP: (142-163)/(67-94) 150/67 (01/31 0725) SpO2:  [90 %-92 %] 90 % (01/31 0645) Weight:  [275 lb (124.7 kg)] 275 lb (124.7 kg) (01/31 0623)    Weight change: Filed Weights   10/25/16 0623  Weight: 275 lb (124.7 kg)    Intake/Output:   Intake/Output Summary (Last 24 hours) at 10/25/16 1145 Last data filed at 10/25/16 1107  Gross per 24 hour  Intake                0 ml  Output             2301 ml  Net            -2301 ml     Physical Exam: General:  Obese. NAD at rest.  HEENT: normal Neck: supple. JVP difficult but does not appear significantly elevated. Carotids 2+ bilat; no bruits. No thyromegaly or nodule noted.  Cor: PMI nondisplaced. RRR. No rubs, gallops or murmurs. Lungs: Distant. Mildly diminised Abdomen: soft, nontender, nondistended. No hepatosplenomegaly. No bruits or masses. Good bowel sounds. Extremities: no cyanosis, clubbing, rash, edema Neuro: alert & orientedx3, cranial nerves grossly intact. moves all 4 extremities w/o difficulty. Affect pleasant  Telemetry: Reviewed, NSR 80s  Labs: Basic Metabolic Panel:  Recent Labs Lab 10/25/16 0646  NA 140  K 4.1  CL 110  CO2 17*  GLUCOSE 111*  BUN 10  CREATININE 1.26*  CALCIUM 9.2    Liver Function Tests:  Recent Labs Lab 10/25/16 0646  AST 27  ALT 20  ALKPHOS 63  BILITOT 0.3  PROT 6.8  ALBUMIN 3.5   No results for input(s): LIPASE, AMYLASE in the last 168 hours. No results for input(s): AMMONIA in the last 168 hours.  CBC:  Recent Labs Lab 10/25/16 0732  WBC 2.8*  NEUTROABS 1.4*  HGB 14.5  HCT 43.2  MCV 81.4  PLT 259    Cardiac Enzymes:  Recent Labs Lab 10/25/16 1019  TROPONINI 0.69*    BNP: BNP (last 3 results)  Recent Labs  10/25/16 0647  BNP 1,043.3*    ProBNP (last 3 results) No results for input(s): PROBNP in the last 8760 hours.   CBG: No results for input(s): GLUCAP in the last 168  hours.  Coagulation Studies: No results for input(s): LABPROT, INR in the last 72 hours.  Other results: EKG: NSR with PVCs, No ST elevation  Imaging: Dg Chest 2 View  Result Date: 10/25/2016 CLINICAL DATA:  Chest pain . EXAM: CHEST  2 VIEW COMPARISON:  09/09/2013 . FINDINGS: Mediastinum and hilar structures normal. Prominent cardiomegaly with mild   pulmonary vascular prominence. No focal infiltrate. Low lung volumes. Small right pleural effusion. No pneumothorax . IMPRESSION: 1.  Prominent cardiomegaly with mild pulmonary vascular prominence. 2. Low lung volumes. No focal infiltrate. Small right pleural effusion. Electronically Signed   By: Thomas  Register   On: 10/25/2016 07:59      Medications:     Current Medications: . aspirin  162 mg Oral Once  . aspirin EC  81 mg Oral Daily  . enoxaparin (LOVENOX) injection  40 mg Subcutaneous Q24H     Infusions:    Assessment/Plan   Toan Alcoser is a 50 y.o. male admitted with chest pain. Found to have elevated BNP and initial troponin with slight uptrend.  HF team consulted with A/C systolic CHF.   1. Chest pain -> ?NSTEMI - Picture concerning for ACS. No h/o of work up. Will send for LHC. Pt aware and agrees - Relieved with NTG and ASA.   - Troponin 0.69 follow q 6 hrs. - Start heparin gtt.  - Add statin.  2. Acute on chronic systolic CHF - Volume status mild elevated at most.  Weight has been stable on po meds.  - Will need repeat Echo.  Possible ICD consideration if EF remains depressed. - Will titrate meds as possible 3. HTN - Will titrate meds in setting of treating his HF.   Length of Stay: 0  Michael Andrew Tillery, PA-C  10/25/2016, 11:45 AM  Advanced Heart Failure Team Pager [phone] (M-F; 7a - 4p)  Please contact [facility] for night-coverage after hours (4p -7a ) and weekends on [email or web address]  Patient seen with PA, agree with the above note.  Patient has history of chronic systolic CHF with previous EF 25-30%.   He has never had an ischemic evaluation (was concerned that his insurance would not cover cath). He never had an MI in the past.  In terms of CHF, he has been symptomatically stable.  No significant dyspnea, weight has been coming down with increased exercise.  His BNP is high but he really does not look particularly volume overloaded on exam.    He actually came to the ER today because of about 2 hours of central substernal chest pain that started while he was driving. The chest pain persisted until he came to the ER and then resolved with NTG.  Initial troponin 0.69.  ECG with NSR, poor R wave progression (similar to the past).    I suspect that the main issue here is ACS/NSTEMI.  We will admit him and arrange for cardiac cath.  Given his pre-existing low EF, it is certainly possible that he has very extensive coronary disease.    Hedda Crumbley 10/25/2016 12:34 PM  

## 2016-10-25 NOTE — Progress Notes (Addendum)
ANTICOAGULATION CONSULT NOTE - Initial Consult  Pharmacy Consult for Heparin Indication: chest pain/ACS  Allergies  Allergen Reactions  . Ibuprofen     headache    Patient Measurements: Height: 6' (182.9 cm) Weight: 275 lb (124.7 kg) IBW/kg (Calculated) : 77.6 Heparin Dosing Weight: 105.5 kg  Vital Signs: Temp: 97.9 F (36.6 C) (01/31 0623) Temp Source: Axillary (01/31 0623) BP: 121/85 (01/31 1200) Pulse Rate: 72 (01/31 1200)  Labs:  Recent Labs  10/25/16 0646 10/25/16 0732 10/25/16 1019  HGB  --  14.5  --   HCT  --  43.2  --   PLT  --  259  --   CREATININE 1.26*  --   --   TROPONINI  --   --  0.69*    Estimated Creatinine Clearance: 93.5 mL/min (by C-G formula based on SCr of 1.26 mg/dL (H)).   Medical History: Past Medical History:  Diagnosis Date  . Acute systolic heart failure 09/07/2013   EF 20% on 09/08/13. 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 Hypertension 10/25/2016    Medications:   (Not in a hospital admission) Scheduled:  . aspirin EC  81 mg Oral Daily  . enoxaparin (LOVENOX) injection  40 mg Subcutaneous Q24H  . lisinopril  20 mg Oral Daily   Infusions:    Assessment: 51yo male with history of HTN and CHF with LVEF of 25-30% in 2015 presents with CP. Pharmacy is consulted to dose heparin for ACS/chest pain. Patient received lovenox 40mg  subcutaneously at 1200 today so will give half bolus.  Goal of Therapy:  Heparin level 0.3-0.7 units/ml Monitor platelets by anticoagulation protocol: Yes   Plan:  Give 2000 units bolus x 1 Start heparin infusion at 1450 units/hr Check anti-Xa level in 6 hours and daily while on heparin Continue to monitor H&H and platelets  Arlean Hopping. Newman Pies, PharmD, BCPS Clinical Pharmacist [phone] 10/25/2016,12:41 PM

## 2016-10-26 ENCOUNTER — Encounter (HOSPITAL_COMMUNITY): Payer: Self-pay | Admitting: Cardiovascular Disease

## 2016-10-26 ENCOUNTER — Other Ambulatory Visit (HOSPITAL_COMMUNITY): Payer: BLUE CROSS/BLUE SHIELD

## 2016-10-26 ENCOUNTER — Observation Stay (HOSPITAL_BASED_OUTPATIENT_CLINIC_OR_DEPARTMENT_OTHER): Payer: BLUE CROSS/BLUE SHIELD

## 2016-10-26 ENCOUNTER — Ambulatory Visit: Payer: BLUE CROSS/BLUE SHIELD | Admitting: Cardiology

## 2016-10-26 ENCOUNTER — Telehealth: Payer: Self-pay | Admitting: Internal Medicine

## 2016-10-26 DIAGNOSIS — I509 Heart failure, unspecified: Secondary | ICD-10-CM | POA: Diagnosis not present

## 2016-10-26 DIAGNOSIS — I110 Hypertensive heart disease with heart failure: Secondary | ICD-10-CM | POA: Diagnosis not present

## 2016-10-26 DIAGNOSIS — I428 Other cardiomyopathies: Secondary | ICD-10-CM | POA: Diagnosis not present

## 2016-10-26 DIAGNOSIS — I214 Non-ST elevation (NSTEMI) myocardial infarction: Secondary | ICD-10-CM | POA: Diagnosis not present

## 2016-10-26 DIAGNOSIS — I5023 Acute on chronic systolic (congestive) heart failure: Secondary | ICD-10-CM | POA: Diagnosis not present

## 2016-10-26 LAB — LIPID PANEL
Cholesterol: 209 mg/dL — ABNORMAL HIGH (ref 0–200)
HDL: 29 mg/dL — AB (ref >40)
LDL Cholesterol: 155 mg/dL — ABNORMAL HIGH (ref 0–99)
Total CHOL/HDL Ratio: 7.2 RATIO
Triglycerides: 126 mg/dL (ref <150)
VLDL: 25 mg/dL (ref 0–40)

## 2016-10-26 LAB — BASIC METABOLIC PANEL
ANION GAP: 6 (ref 5–15)
BUN: 10 mg/dL (ref 6–20)
CHLORIDE: 112 mmol/L — AB (ref 101–111)
CO2: 21 mmol/L — ABNORMAL LOW (ref 22–32)
Calcium: 8.8 mg/dL — ABNORMAL LOW (ref 8.9–10.3)
Creatinine, Ser: 1.25 mg/dL — ABNORMAL HIGH (ref 0.61–1.24)
GFR calc non Af Amer: >60 mL/min (ref >60)
Glucose, Bld: 102 mg/dL — ABNORMAL HIGH (ref 65–99)
POTASSIUM: 4.4 mmol/L (ref 3.5–5.1)
Sodium: 139 mmol/L (ref 135–145)

## 2016-10-26 LAB — ECHOCARDIOGRAM COMPLETE
Height: 72 in
Weight: 4456 oz

## 2016-10-26 LAB — HCV COMMENT:

## 2016-10-26 LAB — CBC
HCT: 44 % (ref 39.0–52.0)
Hemoglobin: 14.1 g/dL (ref 13.0–17.0)
MCH: 26.1 pg (ref 26.0–34.0)
MCHC: 32 g/dL (ref 30.0–36.0)
MCV: 81.3 fL (ref 78.0–100.0)
PLATELETS: 253 10*3/uL (ref 150–400)
RBC: 5.41 MIL/uL (ref 4.22–5.81)
RDW: 13.5 % (ref 11.5–15.5)
WBC: 3.5 10*3/uL — AB (ref 4.0–10.5)

## 2016-10-26 LAB — HEPATITIS C ANTIBODY (REFLEX): HCV Ab: <0.1 s/co ratio (ref 0.0–0.9)

## 2016-10-26 LAB — TROPONIN I: Troponin I: 7.7 ng/mL (ref <0.03)

## 2016-10-26 LAB — HIV ANTIBODY (ROUTINE TESTING W REFLEX): HIV Screen 4th Generation wRfx: NONREACTIVE

## 2016-10-26 LAB — MAGNESIUM: MAGNESIUM: 1.8 mg/dL (ref 1.7–2.4)

## 2016-10-26 MED ORDER — PERFLUTREN LIPID MICROSPHERE
1.0000 mL | INTRAVENOUS | Status: AC | PRN
  Administered 2016-10-26: 3 mL via INTRAVENOUS
  Filled 2016-10-26: qty 10

## 2016-10-26 MED ORDER — ISOSORB DINITRATE-HYDRALAZINE 20-37.5 MG PO TABS
0.5000 | ORAL_TABLET | Freq: Three times a day (TID) | ORAL | Status: DC
  Administered 2016-10-26: 0.5 via ORAL
  Filled 2016-10-26: qty 1

## 2016-10-26 MED ORDER — ISOSORB DINITRATE-HYDRALAZINE 20-37.5 MG PO TABS: 0.5000 | ORAL_TABLET | Freq: Three times a day (TID) | ORAL | 0 refills | Status: AC

## 2016-10-26 MED ORDER — ASPIRIN 81 MG PO TBEC: 81.0000 mg | DELAYED_RELEASE_TABLET | Freq: Every day | ORAL | 0 refills | Status: AC

## 2016-10-26 MED ORDER — FUROSEMIDE 10 MG/ML IJ SOLN: 40.0000 mg | Freq: Once | INTRAMUSCULAR | Status: DC

## 2016-10-26 MED ORDER — FUROSEMIDE 40 MG PO TABS
40.0000 mg | ORAL_TABLET | Freq: Every day | ORAL | Status: DC
  Administered 2016-10-26: 40 mg via ORAL
  Filled 2016-10-26: qty 1

## 2016-10-26 MED ORDER — METOPROLOL SUCCINATE ER 100 MG PO TB24: 100.0000 mg | ORAL_TABLET | Freq: Two times a day (BID) | ORAL | 0 refills | Status: AC

## 2016-10-26 MED ORDER — ATORVASTATIN CALCIUM 80 MG PO TABS: 80.0000 mg | ORAL_TABLET | Freq: Every day | ORAL | 0 refills | Status: AC

## 2016-10-26 MED ORDER — DICLOFENAC SODIUM 1 % TD GEL
2.0000 g | Freq: Four times a day (QID) | TRANSDERMAL | 0 refills | Status: AC
Start: 2016-10-26 — End: ?

## 2016-10-26 MED ORDER — MAGNESIUM SULFATE 2 GM/50ML IV SOLN
2.0000 g | Freq: Once | INTRAVENOUS | Status: AC
Start: 2016-10-26 — End: 2016-10-26
  Administered 2016-10-26: 2 g via INTRAVENOUS
  Filled 2016-10-26: qty 50

## 2016-10-26 NOTE — Progress Notes (Signed)
  Echocardiogram 2D Echocardiogram has been performed.  Noah Floyd 10/26/2016, 11:50 AM

## 2016-10-26 NOTE — Progress Notes (Signed)
Subjective: Noah Floyd states he is doing well today. He has not complained of recurrent or worsening chest pain, dyspnea or lightheadedness. He felt the Voltaren gel was helpful in relieving some of the chest pain.    Objective: Vital signs in last 24 hours: Vitals:   10/25/16 2345 10/26/16 0000 10/26/16 0022 10/26/16 0413  BP: 102/82 132/73 138/70 105/72  Pulse: 97 79 85 72  Resp:    18  Temp:   98.4 F (36.9 C) 98.4 F (36.9 C)  TempSrc:   Oral Oral  SpO2:    98%  Weight:    126.3 kg (278 lb 8 oz)  Height:       Weight change: -0.039 kg (-1.4 oz)  Intake/Output Summary (Last 24 hours) at 10/26/16 1143 Last data filed at 10/26/16 0854  Gross per 24 hour  Intake             1086 ml  Output              975 ml  Net              111 ml   BP 105/72 (BP Location: Left Arm)   Pulse 72   Temp 98.4 F (36.9 C) (Oral)   Resp 18   Ht 6' (1.829 m)   Wt 126.3 kg (278 lb 8 oz) Comment: scale a  SpO2 98%   BMI 37.77 kg/m   General Appearance:    Alert, cooperative, no distress, appears stated age  Lungs:     Clear to auscultation bilaterally, respirations unlabored  Chest wall:    No tenderness or deformity  Heart:    Regular rate and rhythm, S1 and S2 normal, no murmur, rub   or gallop  Abdomen:     Soft, non-tender, bowel sounds active all four quadrants,    no masses, no organomegaly  Extremities:   Extremities normal, atraumatic, no cyanosis or edema  Skin:   Skin color, texture, turgor normal, no rashes or lesions   Lab Results: Basic Metabolic Panel:  Recent Labs Lab 10/25/16 0646 10/26/16 0503  NA 140 139  K 4.1 4.4  CL 110 112*  CO2 17* 21*  GLUCOSE 111* 102*  BUN 10 10  CREATININE 1.26* 1.25*  CALCIUM 9.2 8.8*  MG  --  1.8   Liver Function Tests:  Recent Labs Lab 10/25/16 0646  AST 27  ALT 20  ALKPHOS 63  BILITOT 0.3  PROT 6.8  ALBUMIN 3.5   CBC:  Recent Labs Lab 10/25/16 0732 10/26/16 0503  WBC 2.8* 3.5*  NEUTROABS 1.4*  --   HGB 14.5  14.1  HCT 43.2 44.0  MCV 81.4 81.3  PLT 259 253   Cardiac Enzymes:  Recent Labs Lab 10/25/16 1019 10/25/16 1513 10/25/16 2130  TROPONINI 0.69* 3.71* 5.93*   Fasting Lipid Panel:  Recent Labs Lab 10/26/16 0503  CHOL 209*  HDL 29*  LDLCALC 155*  TRIG 126  CHOLHDL 7.2   Coagulation:  Recent Labs Lab 10/25/16 1513  LABPROT 14.2  INR 1.09   Studies/Results: Dg Chest 2 View  Result Date: 10/25/2016 CLINICAL DATA:  Chest pain . EXAM: CHEST  2 VIEW COMPARISON:  09/09/2013 . FINDINGS: Mediastinum and hilar structures normal. Prominent cardiomegaly with mild pulmonary vascular prominence. No focal infiltrate. Low lung volumes. Small right pleural effusion. No pneumothorax . IMPRESSION: 1.  Prominent cardiomegaly with mild pulmonary vascular prominence. 2. Low lung volumes. No focal infiltrate. Small right pleural effusion. Electronically Signed  ByMaisie Fus  Register   On: 10/25/2016 07:59   Medications: I have reviewed the patient's current medications. Scheduled Meds: . aspirin EC  81 mg Oral Daily  . atorvastatin  80 mg Oral q1800  . diclofenac sodium  2 g Topical QID  . enoxaparin (LOVENOX) injection  40 mg Subcutaneous Q24H  . furosemide  40 mg Oral Daily  . isosorbide-hydrALAZINE  0.5 tablet Oral TID  . lisinopril  20 mg Oral Daily  . metoprolol succinate  100 mg Oral BID  . sodium chloride flush  3 mL Intravenous Q12H  . sodium chloride flush  3 mL Intravenous Q12H  . sodium chloride flush  3 mL Intravenous Q12H  . sodium chloride flush  3 mL Intravenous Q12H   Continuous Infusions: . sodium chloride 10 mL/hr at 10/25/16 2256   PRN Meds:.sodium chloride, sodium chloride, sodium chloride, sodium chloride, acetaminophen, acetaminophen, diazepam, nitroGLYCERIN, ondansetron (ZOFRAN) IV, ondansetron (ZOFRAN) IV, perflutren lipid microspheres (DEFINITY) IV suspension, sodium chloride flush, sodium chloride flush, sodium chloride flush, sodium chloride  flush Assessment/Plan: Principal Problem:   Chest pain Active Problems:   Acute on chronic systolic CHF (congestive heart failure) (HCC)   Obesity   Hypertension   Leukopenia  Acute on chronic systolic CHF: Appreciate cardiology recs. 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 Will focus on optimizing medical care. ECHO in 08/2013 showed LV EF 20-25%, on 12/17/13 was estimated to be 25-30%. Now estimated to 10-15%. Unclear etiology originally chalked up to possible viral cardiomyopathy given pneumonia at the time of presentation, but technically idiopathic at this point. Patient's mother and maternal grandmother both had "enlarged hearts." Per HFSA recs, patient may benefit from a genetic workup for hereditary cardiomyopathy. - Complete ECHO pending - Toprol XL 100mg  BID - lisinopril 20mg  daily. Plan to transition to [facility] eventually - Lasix 40mg  daily  ACS/NSTEMI: Trops trended 0.69 -> 3.71 -> 5.93. Cath showed cut-off in small PLV branch, likely cause of NSTEMI but unlikely to have caused acute worsening of CHF. May have been true plaque rupture, but cardio-embolism also possible. - ECHO as above to evaluate for LV thrombus - aspirin 81mg  daily - atorvastatin 80mg  daily - nitroglycerin 0.4mg  q73min PRN for chest pain - Patient expressed desire to not start Plavix  Hypertension: Blood pressure stable this morning. Continue antihypertensive regimen for CHF as above.  Other: Diet: Heart healthy DVT prophylaxis: Lovenox Code: Full code  Dispo: Patient wants to be discharged today, will work with cardiology to plan appropriate discharge.   This is a Psychologist, occupational Note.  The care of the patient was discussed with Dr. Karma Greaser and the assessment and plan formulated with their assistance.  Please see their attached note for official documentation of the daily encounter.   LOS: 0 days   Elmon Kirschner, Medical Student 10/26/2016, 11:43 AM

## 2016-10-26 NOTE — Telephone Encounter (Signed)
Transition Care Management Follow-up Telephone Call   Date discharged?2/1   How have you been since you were released from the hospital? good   Do you understand why you were in the hospital? yes   Do you understand the discharge instructions? yes   Where were you discharged to? home   Items Reviewed:  Medications reviewed: yes  Allergies reviewed: yes  Dietary changes reviewed: yes  Referrals reviewed: yes   Functional Questionnaire:   Activities of Daily Living (ADLs):   He states they are independent in the following: independent States they require assistance with the following: independent   Any transportation issues/concerns?: yes   Any patient concerns? no   Confirmed importance and date/time of follow-up visits scheduled yes  Provider Appointment booked with dr boswell 2/8 1315  Confirmed with patient if condition begins to worsen call PCP or go to the ER.  Patient was given the office number and encouraged to call back with question or concerns.  : yes

## 2016-10-26 NOTE — Progress Notes (Signed)
Call placed to [facility] to notify of telemetry monitoring d/c.   

## 2016-10-26 NOTE — Progress Notes (Signed)
Subjective: Noah Floyd feels much improved today. Report his chest pain is improved with the Voltaren gel. Denies any further chest pain or shortness of breath. Very eager to go home today.  Objective: Vital signs in last 24 hours: Vitals:   10/25/16 2345 10/26/16 0000 10/26/16 0022 10/26/16 0413  BP: 102/82 132/73 138/70 105/72  Pulse: 97 79 85 72  Resp:    18  Temp:   98.4 F (36.9 C) 98.4 F (36.9 C)  TempSrc:   Oral Oral  SpO2:    98%  Weight:    278 lb 8 oz (126.3 kg)  Height:       Weight change: -1.4 oz (-0.039 kg)  Intake/Output Summary (Last 24 hours) at 10/26/16 1225 Last data filed at 10/26/16 0854  Gross per 24 hour  Intake             1086 ml  Output              975 ml  Net              111 ml    Physical Exam  Constitutional: He is well-developed, well-nourished, and in no distress. No distress.  Cardiovascular: Normal rate and regular rhythm.   Distant heart sounds Difficult to assess JVD due to body habitus  Pulmonary/Chest: Effort normal and breath sounds normal. He has no wheezes. He has no rales.  Musculoskeletal: He exhibits no edema.   Medications: I have reviewed the patient's current medications. Scheduled Meds: . aspirin EC  81 mg Oral Daily  . atorvastatin  80 mg Oral q1800  . diclofenac sodium  2 g Topical QID  . enoxaparin (LOVENOX) injection  40 mg Subcutaneous Q24H  . furosemide  40 mg Oral Daily  . isosorbide-hydrALAZINE  0.5 tablet Oral TID  . lisinopril  20 mg Oral Daily  . metoprolol succinate  100 mg Oral BID  . sodium chloride flush  3 mL Intravenous Q12H  . sodium chloride flush  3 mL Intravenous Q12H  . sodium chloride flush  3 mL Intravenous Q12H  . sodium chloride flush  3 mL Intravenous Q12H   Continuous Infusions: . sodium chloride 10 mL/hr at 10/25/16 2256   PRN Meds:.sodium chloride, sodium chloride, sodium chloride, sodium chloride, acetaminophen, acetaminophen, diazepam, nitroGLYCERIN, ondansetron (ZOFRAN) IV,  ondansetron (ZOFRAN) IV, perflutren lipid microspheres (DEFINITY) IV suspension, sodium chloride flush, sodium chloride flush, sodium chloride flush, sodium chloride flush Assessment/Plan:  NSTEMI: Troponin trended up to 5.93 overnight. No further chest pain. Had cardiac cath yesterday with the report indicating that his coronaries were clean. 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 Unclear if this was plaque rupture or embolic in nature.  He was started on ASA and atorvastatin. Continue Lisinopril and Metoprolol. Cardiology added Bidil today.  -Follow up troponin today -ECHO  HFrEF: EF only 10-15% on cath yesterday. Previously in 2014-2015 was 20-30%. Unclear etiology. Getting repeat ECHO today. Will need discussion with cardiology for ICD placement as well as possible cardiac biopsy given unclear diagnosis. Euvolemic appearing on exam today.  -continue metoprolol and lisinopril -Resume Lasix 40 mg PO daily  HTN: BP well controlled. Continue meds as above.  Dispo: Discharge today pending ECHO results  The patient does not have a current PCP (No Pcp Per Patient) and does need an [facility] hospital follow-up appointment  after discharge.  The patient does not have transportation limitations that hinder transportation to clinic appointments.  .Services Needed at time of discharge: Y = Yes, Blank = No PT:   OT:   RN:   Equipment:   Other:     LOS: 0 days   Valentino Nose, MD [facility] PGY-1 [phone] 10/26/2016, 12:25 PM

## 2016-10-26 NOTE — Discharge Instructions (Signed)
Noah Floyd, Noah Floyd were admitted to the hospital because of your chest pain and were found to have a type of heart attack called an NSTEMI. You underwent a left heart catheterization to see if there were any arteries feeding your heart that were blocked and required a stent. No major arteries were blocked but there may have been one small one that was blocked, but it was too small to allow for a stent. This is overall a good sign but given your already compromised heart function, it is important to follow up closely with your cardiologist and make sure you are taking the prescribed medications to prevent your condition from worsening.   In addition to following up with your cardiologist, please follow up with the Acute Care Clinic at [facility] in [place] on February 8th, 2018 at 1:15pm.   Acute Coronary Syndrome Acute coronary syndrome (ACS) is a serious problem in which there is suddenly not enough blood and oxygen supplied to the heart. ACS may mean that one or more of the blood vessels in your heart (coronary arteries) may be blocked. ACS can result in chest pain or a heart attack (myocardial infarction or MI). What are the causes? This condition is caused by atherosclerosis, which is the buildup of fat and cholesterol (plaque) on the inside of the arteries. Over time, the plaque may narrow or block the artery, and this will lessen blood flow to the heart. Plaque can also become weak and break off within a coronary artery to form a clot and cause a sudden blockage. What increases the risk? The risk factors of this condition include:  High cholesterol levels.  High blood pressure (hypertension).  Smoking.  Diabetes.  Age.  Family history of chest pain, heart disease, or stroke.  Lack of exercise. What are the signs or symptoms? The most common signs of this condition include:  Chest pain, which can be:  A crushing or squeezing in the chest.  A tightness, pressure,  fullness, or heaviness in the chest.  Present for more than a few minutes, or it can stop and recur.  Pain in the arms, neck, jaw, or back.  Unexplained heartburn or indigestion.  Shortness of breath.  Nausea.  Sudden cold sweats.  Feeling light-headed or dizzy. Sometimes, this condition has no symptoms. How is this diagnosed? ACS may be diagnosed through the following tests:  Electrocardiogram (ECG).  Blood tests.  Coronary angiogram. This is a procedure to look at the coronary arteries to see if there is any blockage. How is this treated? Treatment for ACS may include:  Healthy behavioral changes to reduce or control risk factors.  Medicine.  Coronary stenting.A stent helps to keep an artery open.  Coronary angioplasty. This procedure widens a narrowed or blocked artery.  Coronary artery bypass surgery. This will allow your blood to pass the blockage (bypass) to reach your heart. Follow these instructions at home: Eating and drinking  Follow a heart-healthy diet. A dietitian can you help to educate you about healthy food options and changes.  Use healthy cooking methods such as roasting, grilling, broiling, baking, poaching, steaming, or stir-frying. Talk to a dietitian to learn more about healthy cooking methods. Medicines  Take medicines only as directed by your health care provider.  Do not take the following medicines unless your health care provider approves:  Nonsteroidal anti-inflammatory drugs (NSAIDs), such as ibuprofen, naproxen, or celecoxib.  Vitamin supplements that contain vitamin A, vitamin E, or both.  Hormone replacement therapy  that contains estrogen with or without progestin.  Stop illegal drug use. Activity  Follow an exercise program that is approved by your health care provider.  Plan rest periods when you are fatigued. Lifestyle  Do not use any tobacco products, including cigarettes, chewing tobacco, or electronic cigarettes. If  you need help quitting, ask your health care provider.  If you drink alcohol, and your health care provider approves, limit your alcohol intake to no more than 1 drink per day. One drink equals 12 ounces of beer, 5 ounces of wine, or 1 ounces of hard liquor.  Learn to manage stress.  Maintain a healthy weight. Lose weight as approved by your health care provider. General instructions  Manage other health conditions, such as hypertension and diabetes, as directed by your health care provider.  Keep all follow-up visits as directed by your health care provider. This is important.  Your health care provider may ask you to monitor your blood pressure. A blood pressure reading consists of a higher number over a lower number, such as 110 over 72, written as 110/72. Ideally, your blood pressure should be:  Below 140/90 if you have no other medical conditions.  Below 130/80 if you have diabetes or kidney disease. Get help right away if:  You have pain in your chest, neck, arm, jaw, stomach, or back that lasts more than a few minutes, is recurring, or is not relieved by taking medicine under your tongue (sublingual nitroglycerin).  You have profuse sweating without cause.  You have unexplained:  Heartburn or indigestion.  Shortness of breath or difficulty breathing.  Nausea or vomiting.  Fatigue.  Feelings of nervousness or anxiety.  Weakness.  Diarrhea.  You have sudden light-headedness or dizziness.  You faint. These symptoms may represent a serious problem that is an emergency. Do not wait to see if the symptoms will go away. Get medical help right away. Call your local emergency services (911 in the [place] Do not drive yourself to the clinic or hospital.  This information is not intended to replace advice given to you by your health care provider. Make sure you discuss any questions you have with your health care provider. Document Released: 09/11/2005 Document Revised:  02/23/2016 Document Reviewed: 01/13/2014 Elsevier Interactive Patient Education  2017 [employer]     Heart Failure Heart failure is a condition in which the heart has trouble pumping blood because it has become weak or stiff. This means that the heart does not pump blood efficiently for the body to work well. For some people with heart failure, fluid may back up into the lungs and there may be swelling (edema) in the lower legs. Heart failure is usually a long-term (chronic) condition. It is important for you to take good care of yourself and follow the treatment plan from your health care provider. What are the causes? This condition is caused by some health problems, including:  High blood pressure (hypertension). Hypertension causes the heart muscle to work harder than normal. High blood pressure eventually causes the heart to become stiff and weak.  Coronary artery disease (CAD). CAD is the buildup of cholesterol and fat (plaques) in the arteries of the heart.  Heart attack (myocardial infarction). Injured tissue, which is caused by the heart attack, does not contract as well and the heart's ability to pump blood is weakened.  Abnormal heart valves. When the heart valves do not open and close properly, the heart muscle must pump harder to keep the blood flowing.  Heart muscle disease (cardiomyopathy or myocarditis). Heart muscle disease is damage to the heart muscle from a variety of causes, such as drug or alcohol abuse, infections, or unknown causes. These can increase the risk of heart failure.  Lung disease. When the lungs do not work properly, the heart must work harder. What increases the risk? Risk of heart failure increases as a person ages. This condition is also more likely to develop in people who:  Are overweight.  Are male.  Smoke or chew tobacco.  Abuse alcohol or illegal drugs.  Have taken medicines that can damage the heart, such as chemotherapy drugs.  Have  diabetes.  High blood sugar (glucose) is associated with high fat (lipid) levels in the blood.  Diabetes can also damage tiny blood vessels that carry nutrients to the heart muscle.  Have abnormal heart rhythms.  Have thyroid problems.  Have low blood counts (anemia). What are the signs or symptoms? Symptoms of this condition include:  Shortness of breath with activity, such as when climbing stairs.  Persistent cough.  Swelling of the feet, ankles, legs, or abdomen.  Unexplained weight gain.  Difficulty breathing when lying flat (orthopnea).  Waking from sleep because of the need to sit up and get more air.  Rapid heartbeat.  Fatigue and loss of energy.  Feeling light-headed, dizzy, or close to fainting.  Loss of appetite.  Nausea.  Increased urination during the night (nocturia).  Confusion. How is this diagnosed? This condition is diagnosed based on:  Medical history, symptoms, and a physical exam.  Diagnostic tests, which may include:  Echocardiogram.  Electrocardiogram (ECG).  Chest X-ray.  Blood tests.  Exercise stress test.  Radionuclide scans.  Cardiac catheterization and angiogram. How is this treated? Treatment for this condition is aimed at managing the symptoms of heart failure. Medicines, behavioral changes, or other treatments may be necessary to treat heart failure. Medicines  These may include:  Angiotensin-converting enzyme (ACE) inhibitors. This type of medicine blocks the effects of a blood protein called angiotensin-converting enzyme. ACE inhibitors relax (dilate) the blood vessels and help to lower blood pressure.  Angiotensin receptor blockers (ARBs). This type of medicine blocks the actions of a blood protein called angiotensin. ARBs dilate the blood vessels and help to lower blood pressure.  Water pills (diuretics). Diuretics cause the kidneys to remove salt and water from the blood. The extra fluid is removed through  urination, leaving a lower volume of blood that the heart has to pump.  Beta blockers. These improve heart muscle strength and they prevent the heart from beating too quickly.  Digoxin. This increases the force of the heartbeat. Healthy behavior changes  These may include:  Reaching and maintaining a healthy weight.  Stopping smoking or chewing tobacco.  Eating heart-healthy foods.  Limiting or avoiding alcohol.  Stopping use of street drugs (illegal drugs).  Physical activity. Other treatments  These may include:  Surgery to open blocked coronary arteries or repair damaged heart valves.  Placement of a biventricular pacemaker to improve heart muscle function (cardiac resynchronization therapy). This device paces both the right ventricle and left ventricle.  Placement of a device to treat serious abnormal heart rhythms (implantable cardioverter defibrillator, or ICD).  Placement of a device to improve the pumping ability of the heart (left ventricular assist device, or LVAD).  Heart transplant. This can cure heart failure, and it is considered for certain patients who do not improve with other therapies. Follow these instructions at home: Medicines  Take  over-the-counter and prescription medicines only as told by your health care provider. Medicines are important in reducing the workload of your heart, slowing the progression of heart failure, and improving your symptoms.  Do not stop taking your medicine unless your health care provider told you to do that.  Do not skip any dose of medicine.  Refill your prescriptions before you run out of medicine. You need your medicines every day. Eating and drinking  Eat heart-healthy foods. Talk with a dietitian to make an eating plan that is right for you.  Choose foods that contain no trans fat and are low in saturated fat and cholesterol. Healthy choices include fresh or frozen fruits and vegetables, fish, lean meats, legumes,  fat-free or low-fat dairy products, and whole-grain or high-fiber foods.  Limit salt (sodium) if directed by your health care provider. Sodium restriction may reduce symptoms of heart failure. Ask a dietitian to recommend heart-healthy seasonings.  Use healthy cooking methods instead of frying. Healthy methods include roasting, grilling, broiling, baking, poaching, steaming, and stir-frying.  Limit your fluid intake if directed by your health care provider. Fluid restriction may reduce symptoms of heart failure. Lifestyle  Stop smoking or using chewing tobacco. Nicotine and tobacco can damage your heart and your blood vessels. Do not use nicotine gum or patches before talking to your health care provider.  Limit alcohol intake to no more than 1 drink per day for non-pregnant women and 2 drinks per day for men. One drink equals 12 oz of beer, 5 oz of wine, or 1 oz of hard liquor.  Drinking more than that is harmful to your heart. Tell your health care provider if you drink alcohol several times a week.  Talk with your health care provider about whether any level of alcohol use is safe for you.  If your heart has already been damaged by alcohol or you have severe heart failure, drinking alcohol should be stopped completely.  Stop use of illegal drugs.  Lose weight if directed by your health care provider. Weight loss may reduce symptoms of heart failure.  Do moderate physical activity if directed by your health care provider. People who are elderly and people with severe heart failure should consult with a health care provider for physical activity recommendations. Monitor important information  Weigh yourself every day. Keeping track of your weight daily helps you to notice excess fluid sooner.  Weigh yourself every morning after you urinate and before you eat breakfast.  Wear the same amount of clothing each time you weigh yourself.  Record your daily weight. Provide your health care  provider with your weight record.  Monitor and record your blood pressure as told by your health care provider.  Check your pulse as told by your health care provider. Dealing with extreme temperatures  If the weather is extremely hot:  Avoid vigorous physical activity.  Use air conditioning or fans or seek a cooler location.  Avoid caffeine and alcohol.  Wear loose-fitting, lightweight, and light-colored clothing.  If the weather is extremely cold:  Avoid vigorous physical activity.  Layer your clothes.  Wear mittens or gloves, a hat, and a scarf when you go outside.  Avoid alcohol. General instructions  Manage other health conditions such as hypertension, diabetes, thyroid disease, or abnormal heart rhythms as told by your health care provider.  Learn to manage stress. If you need help to do this, ask your health care provider.  Plan rest periods when fatigued.  Get ongoing education  and support as needed.  Participate in or seek rehabilitation as needed to maintain or improve independence and quality of life.  Stay up to date with immunizations. Keeping current on pneumococcal and influenza immunizations is especially important to prevent respiratory infections.  Keep all follow-up visits as told by your health care provider. This is important. Contact a health care provider if:  You have a rapid weight gain.  You have increasing shortness of breath that is unusual for you.  You are unable to participate in your usual physical activities.  You tire easily.  You cough more than normal, especially with physical activity.  You have any swelling or more swelling in areas such as your hands, feet, ankles, or abdomen.  You are unable to sleep because it is hard to breathe.  You feel like your heart is beating quickly (palpitations).  You become dizzy or light-headed when you stand up. Get help right away if:  You have difficulty breathing.  You notice or  your family notices a change in your awareness, such as having trouble staying awake or having difficulty with concentration.  You have pain or discomfort in your chest.  You have an episode of fainting (syncope). This information is not intended to replace advice given to you by your health care provider. Make sure you discuss any questions you have with your health care provider. Document Released: 09/11/2005 Document Revised: 05/16/2016 Document Reviewed: 04/05/2016 Elsevier Interactive Patient Education  2017 [employer].

## 2016-10-26 NOTE — Discharge Summary (Signed)
Name: Noah Floyd MRN: [id number] DOB: 1966-07-23 51 y.o. PCP: No Pcp Per Patient, will follow up with [facility] Follow up appointment: February 8th, 2018 at 1:15pm  Date of Admission: 10/25/2016  6:15 AM Date of Discharge: 10/26/2016 Attending Physician: Tyson Alias, MD  Discharge Diagnosis: 1. Acute coronary syndrome, NSTEMI 2. Acute on chronic congestive heart failure, HFrEF with LVEF 25-30%  Discharge Medications: Allergies as of 10/26/2016      Reactions   Ibuprofen    headache      Medication List    TAKE these medications   aspirin 81 MG EC tablet Take 1 tablet (81 mg total) by mouth daily. Start taking on:  10/27/2016   atorvastatin 80 MG tablet Commonly known as:  LIPITOR Take 1 tablet (80 mg total) by mouth daily at 6 PM.   diclofenac sodium 1 % Gel Commonly known as:  VOLTAREN Apply 2 g topically 4 (four) times daily.   furosemide 40 MG tablet Commonly known as:  LASIX TAKE ONE TABLET BY MOUTH ONCE DAILY   isosorbide-hydrALAZINE 20-37.5 MG tablet Commonly known as:  BIDIL Take 0.5 tablets by mouth 3 (three) times daily.   lisinopril 20 MG tablet Commonly known as:  PRINIVIL,ZESTRIL Take 1 tablet (20 mg total) by mouth daily.   metoprolol succinate 100 MG 24 hr tablet Commonly known as:  TOPROL-XL Take 1 tablet (100 mg total) by mouth 2 (two) times daily. Take with or immediately following a meal. What changed:  medication strength  how much to take  when to take this   Potassium Chloride ER 20 MEQ Tbcr Take 40 mEq by mouth daily.       Disposition and follow-up:   Mr.Noah Floyd was discharged from [facility] in Stable condition.  At the hospital follow up visit please address:  1. Acute coronary syndrome, NSTEMI: Cath with possible cutoff in small PLV branch not amenable to stenting. Patient started on atorvastatin, Toprol Xl, lisinopril, Lasix, Bidil and aspirin. Goal to transition from lisinopril to  [facility] eventually.  2. Acute on chronic congestive heart failure, HFrEF with LVEF 25-30%: Continue medical management as above. Needs close follow up with cardiology.  3.  Labs / imaging needed at time of follow-up: CBC. Patient was leukopenic on admission with unclear etiology. Improving on discharge.  4.  Pending labs/ test needing follow-up: None  Follow-up Appointments: Follow-up Information    [facility] Follow up on 11/02/2016.   Specialty:  Cardiology Why:  at 330 pm for post hospital follow up. Please bring all of your medications with you to your appointment.  Contact information: [address], [address] [place] [address] [phone]          Hospital Course by problem list: Principal Problem:   Chest pain Active Problems:   Acute on chronic systolic CHF (congestive heart failure) (HCC)   Obesity   Hypertension   Leukopenia   1. ACS/NSTEMI: Patient was admitted with chest pain and shortness of breath that came on suddenly while driving, troponin of 0.7 and no ischemic changes on EKG. Went for left heart cath with no major artery blockage, questionable minor artery cut-off not amenable to stenting. Will manage medically. Patient started on atorvastatin, Toprol Xl, lisinopril, Lasix, Bidil and aspirin. Goal to transition from lisinopril to [facility] eventually.  2. Acute on Chronic CHF, HFrEF with LVEF 25-30%: As above. CXR also showed mild pulmonary edema. Dyspnea improved with Lasix  administration. Repeat Echo showed EF 25-30% with mild mitral regurgitation. Will medically manage as above.  3. Hypertension: Patient was hypertensive to 163/94 with HR in the 90s on admission. Responded well to medical therapy during hospital course. Day of discharge was in 130s/70s.  Discharge Vitals:   BP 131/71 (BP Location: Left Arm)   Pulse 84   Temp 98.4 F (36.9 C) (Oral)   Resp 18   Ht 6' (1.829 m)   Wt 278 lb 8 oz (126.3 kg)  Comment: scale a  SpO2 97%   BMI 37.77 kg/m   Pertinent Labs, Studies, and Procedures:  Recent Labs Lab 10/25/16 0646 10/26/16 0503  NA 140 139  K 4.1 4.4  CL 110 112*  CO2 17* 21*  GLUCOSE 111* 102*  BUN 10 10  CREATININE 1.26* 1.25*  CALCIUM 9.2 8.8*  MG  --  1.8   CBC: Lab 10/25/16 0732 10/26/16 0503  WBC 2.8* 3.5*  NEUTROABS 1.4*  --   HGB 14.5 14.1  HCT 43.2 44.0  MCV 81.4 81.3  PLT 259 253   Cardiac Enzymes: Lab 10/25/16 1513 10/25/16 2130 10/26/16 1158  TROPONINI 3.71* 5.93* 7.70*   Fasting Lipid Panel: Lab 10/26/16 0503  CHOL 209*  HDL 29*  LDLCALC 155*  TRIG 126  CHOLHDL 7.2   Coagulation: Lab 10/25/16 1513  LABPROT 14.2  INR 1.09    Discharge Instructions: Discharge Instructions    Diet - low sodium heart healthy    Complete by:  As directed    Discharge instructions    Complete by:  As directed    I am glad you came in. Please take your medication exactly as instructed.  Follow up with your heart doctor and regular doctor.   Increase activity slowly    Complete by:  As directed      Mr. Noah, Floyd were admitted to the hospital because you were having chest pain and were found to have a type of heart attack called an NSTEMI. You underwent a left heart catheterization to see if there were any arteries feeding your heart that were blocked and required a stent. No major arteries were blocked but there may have been one small one that was blocked, but it was too small to allow for a stent. This is overall a good sign but given your already compromised heart function, it is important to follow up closely with your cardiologist and make sure you are taking the prescribed medications to prevent your condition from worsening.   In addition to following up with your cardiologist, please follow up with the Acute Care Clinic at [facility] in [place] on February 8th, 2018 at 1:15pm.  Signed: Valentino Nose, MD 10/26/2016, 8:10 PM

## 2016-10-26 NOTE — Progress Notes (Signed)
Advanced Heart Failure Rounding Note  Referring Physician: Dr. Oswaldo Done Primary Cardiologist:  Dr Anne Fu  Reason for Consultation: Acute on chronic CHF.  Chest pain.   Subjective:    Admitted yesterday with chest pain concerning for ACS.  Troponin peaked at 5.93.  Taken for urgent cath yesterday, see below.  No intervention.    Got IV Lasix yesterday, good diuresis.   Feeling OK this am. Sleepy. No further chest pain. Denies SOB or lightheadedness/dizziness.  LHC 10/25/16 (reviewed by Dr Shirlee Latch) There appears to be an abrupt cut-off in a small PLV branch. Otherwise, luminal irregularities.  Severe LV dysfunction with estimated EF of 10-15%.  LVEFP 27 mm Hg   Objective:   Weight Range: 278 lb 8 oz (126.3 kg) Body mass index is 37.77 kg/m.   Vital Signs:   Temp:  [98.1 F (36.7 C)-98.4 F (36.9 C)] 98.4 F (36.9 C) (02/01 0413) Pulse Rate:  [0-109] 72 (02/01 0413) Resp:  [0-55] 18 (02/01 0413) BP: (102-166)/(46-109) 105/72 (02/01 0413) SpO2:  [0 %-100 %] 98 % (02/01 0413) Weight:  [274 lb 14.6 oz (124.7 kg)-278 lb 8 oz (126.3 kg)] 278 lb 8 oz (126.3 kg) (02/01 0413) Last BM Date: 10/25/16  Weight change: Filed Weights   10/25/16 0623 10/25/16 1718 10/26/16 0413  Weight: 275 lb (124.7 kg) 274 lb 14.6 oz (124.7 kg) 278 lb 8 oz (126.3 kg)    Intake/Output:   Intake/Output Summary (Last 24 hours) at 10/26/16 0917 Last data filed at 10/26/16 0854  Gross per 24 hour  Intake             1086 ml  Output             2376 ml  Net            -1290 ml     Physical Exam: General: Obese. NAD at rest.   HEENT: Normal Neck: Supple. JVP difficult, does not appear elevated. Carotids 2+ bilat; no bruits. No thyromegaly or nodule noted.  Cor: PMI nondisplaced. RRR. No M/G/R appreciated.  Lungs: Distant. Mildly diminished.  Abdomen: soft, NT, ND, no HSM. No bruits or masses. +BS Extremities: no cyanosis, clubbing, rash, no peripheral edema.  Neuro: alert & orientedx3,  cranial nerves grossly intact. moves all 4 extremities w/o difficulty. Affect pleasant  Telemetry: Reviewed, NSR  Labs: CBC  Recent Labs  10/25/16 0732 10/26/16 0503  WBC 2.8* 3.5*  NEUTROABS 1.4*  --   HGB 14.5 14.1  HCT 43.2 44.0  MCV 81.4 81.3  PLT 259 253   Basic Metabolic Panel  Recent Labs  10/25/16 0646 10/26/16 0503  NA 140 139  K 4.1 4.4  CL 110 112*  CO2 17* 21*  GLUCOSE 111* 102*  BUN 10 10  CREATININE 1.26* 1.25*  CALCIUM 9.2 8.8*  MG  --  1.8   Liver Function Tests  Recent Labs  10/25/16 0646  AST 27  ALT 20  ALKPHOS 63  BILITOT 0.3  PROT 6.8  ALBUMIN 3.5   No results for input(s): LIPASE, AMYLASE in the last 72 hours. Cardiac Enzymes  Recent Labs  10/25/16 1019 10/25/16 1513 10/25/16 2130  TROPONINI 0.69* 3.71* 5.93*    BNP: BNP (last 3 results)  Recent Labs  10/25/16 0647  BNP 1,043.3*    ProBNP (last 3 results) No results for input(s): PROBNP in the last 8760 hours.   D-Dimer No results for input(s): DDIMER in the last 72 hours. Hemoglobin A1C No results for  input(s): HGBA1C in the last 72 hours. Fasting Lipid Panel  Recent Labs  10/26/16 0503  CHOL 209*  HDL 29*  LDLCALC 155*  TRIG 126  CHOLHDL 7.2   Thyroid Function Tests No results for input(s): TSH, T4TOTAL, T3FREE, THYROIDAB in the last 72 hours.  Invalid input(s): FREET3  Other results:     Imaging/Studies:   No results found.    Medications:     Scheduled Medications: . aspirin EC  81 mg Oral Daily  . atorvastatin  80 mg Oral q1800  . diclofenac sodium  2 g Topical QID  . enoxaparin (LOVENOX) injection  40 mg Subcutaneous Q24H  . lisinopril  20 mg Oral Daily  . magnesium sulfate 1 - 4 g bolus IVPB  2 g Intravenous Once  . metoprolol succinate  100 mg Oral BID  . sodium chloride flush  3 mL Intravenous Q12H  . sodium chloride flush  3 mL Intravenous Q12H  . sodium chloride flush  3 mL Intravenous Q12H  . sodium chloride flush  3  mL Intravenous Q12H     Infusions: . sodium chloride 10 mL/hr at 10/25/16 2256     PRN Medications:  sodium chloride, sodium chloride, sodium chloride, sodium chloride, acetaminophen, acetaminophen, diazepam, nitroGLYCERIN, ondansetron (ZOFRAN) IV, ondansetron (ZOFRAN) IV, sodium chloride flush, sodium chloride flush, sodium chloride flush, sodium chloride flush   Assessment/Plan   1. Acute on chronic systolic CHF:  LVEF 10-15% by LV gram.  Suspect nonischemic cardiomyopathy despite ACS this admission => only significant CAD was a cut-off of a small PLV branch.  This did not cause his cardiomyopathy.  He was not markedly volume overloaded at admission, think presentation was due to ACS/NSTEMI.  LVEDP was elevated on cath yesterday.  He got IV Lasix yesterday with good diuresis, not volume overloaded by exam and no dyspnea.   - Complete echo pending - Continue Toprol XL 100 mg BID for now.  - Continue lisinopril 20 mg daily for now. Consider transition Entresto eventually.  - Restart home Lasix 40 mg daily. - Add bidil 0.5 tab TID cautiously.  Eventually would like him on spironolactone.  2. NSTEMI: Reviewed cath today, there is a cut-off in a small PLV branch, likely was cause of NSTEMI.  LDL quite high and history of HTN, so could have been true ACS with plaque rupture.  Also would be concerned for cardio-embolism.  - Echo as above to assess for LV thrombus.  - He was not on ASA at home, will need to continue. Will continue high dose statin.   - With NSTEMI, year of Plavix would be ideal but he is reticent to start multiple new meds.  3. HTN: BP not elevated here.   Length of Stay: 0  Luane School  10/26/2016, 9:17 AM  Advanced Heart Failure Team Pager [phone] (M-F; 7a - 4p)  Please contact [facility] for night-coverage after hours (4p -7a ) and weekends on [email or web address]  Patient seen with PA, agree with the above note.  I think that his presentation was most  consistent with NSTEMI rather than CHF.  I reviewed his cath films, looks like possible cut-off in small PLV branch (too small for PCI).  I suspect this caused his symptoms.  Unsure if this was from plaque rupture or embolism from possible LV thrombus.  On exam today, volume looks ok.  - Restart po Lasix.  - Add Bidil 1/2 tab tid.  - He will need to take ASA 81  and atorvastatin at home.  - Echo today.  If no thrombus, he can go home with close followup with Dr Anne Fu.  Meds for home: Lasix 40 daily, lisinopril 20 daily, Toprol XL 100 mg bid, Bidil 1/2 tab tid, atorvastatin 80 daily, ASA 81 daily.   Marca Ancona 10/26/2016 10:58 AM

## 2016-10-26 NOTE — Progress Notes (Signed)
Pt is a/o x 4, ambulating independently in the room. Pt has called his wife requesting pick up from the hospital. Pts vitals are [facility]. Pt denies complaints at this time.   Pts Ivs are discontinued, pts belongings are in his possession.

## 2016-10-26 NOTE — Care Management Note (Signed)
Case Management Note  Patient Details  Name: Noah Floyd MRN: [id number] Date of Birth: 06/03/1966  Subjective/Objective:        Admitted with Chest pain , NSTEMI           Action/Plan: No PCP and patient does not want CM assistance to help arrange apt for a PCP; has private insurance with BCBS with prescription drug coverage; pharmacy of choice is [facility]; he is independent of all ADL's.Bidil coupon given to patient with instructions of usage.  Expected Discharge Date:  10/26/16               Expected Discharge Plan:  Home/Self Care  Discharge planning Services  CM Consult   Status of Service:  In process, will continue to follow  Reola Mosher [phone] 10/26/2016, 2:59 PM

## 2016-10-26 NOTE — Progress Notes (Signed)
Page to [facility], Med Student re pt's upward trending troponin.  [phone]

## 2016-10-26 NOTE — Progress Notes (Signed)
Per Elmon Kirschner, Med student, they are aware of his 7.7 troponin and pt is still cleared for discharge.

## 2016-10-26 NOTE — Progress Notes (Signed)
Refused bed alarm. Will continue to monitor patient. 

## 2016-10-26 NOTE — Telephone Encounter (Signed)
TOC-HFU 11/02/2016 WITH ACC @ 1:15PM per Dr. Karma Greaser

## 2016-10-31 ENCOUNTER — Ambulatory Visit: Payer: BLUE CROSS/BLUE SHIELD | Admitting: Physician Assistant

## 2016-11-01 ENCOUNTER — Encounter: Payer: Self-pay | Admitting: Cardiology

## 2016-11-01 ENCOUNTER — Telehealth: Payer: Self-pay

## 2016-11-01 ENCOUNTER — Ambulatory Visit (INDEPENDENT_AMBULATORY_CARE_PROVIDER_SITE_OTHER): Payer: BLUE CROSS/BLUE SHIELD | Admitting: Cardiology

## 2016-11-01 ENCOUNTER — Other Ambulatory Visit: Payer: Self-pay | Admitting: *Deleted

## 2016-11-01 VITALS — BP 142/84 | HR 50 | Ht 72.0 in | Wt 276.0 lb

## 2016-11-01 DIAGNOSIS — I420 Dilated cardiomyopathy: Secondary | ICD-10-CM | POA: Diagnosis not present

## 2016-11-01 DIAGNOSIS — I5022 Chronic systolic (congestive) heart failure: Secondary | ICD-10-CM | POA: Diagnosis not present

## 2016-11-01 DIAGNOSIS — I110 Hypertensive heart disease with heart failure: Secondary | ICD-10-CM

## 2016-11-01 MED ORDER — SACUBITRIL-VALSARTAN 24-26 MG PO TABS: 1.0000 | ORAL_TABLET | Freq: Two times a day (BID) | ORAL | 6 refills | Status: AC

## 2016-11-01 NOTE — Telephone Encounter (Signed)
**Note De-Identified Irina Okelly Obfuscation** PA for Entresto 24-26 mg has been sent to [facility]. Waiting on response.

## 2016-11-01 NOTE — Patient Instructions (Addendum)
Medication Instructions:  Your physician has recommended you make the following change in your medication:  Stop Lisinopril.  After you have been off lisinopril for 2 days start Entresto 24/26 mg by mouth twice daily.    Labwork: None--to be done at next office visit  Testing/Procedures: none  Follow-Up: Your physician recommends that you schedule a follow-up appointment in: about 2 weeks.  --Scheduled for Feb 27,2018 at 8:15    Any Other Special Instructions Will Be Listed Below (If Applicable).     If you need a refill on your cardiac medications before your next appointment, please call your pharmacy.

## 2016-11-01 NOTE — Progress Notes (Signed)
[address] [place], [place]  [address] Phone: [phone] Fax:  [phone]  Date:  11/01/2016   ID:  Noah Floyd, DOB Feb 17, 1966, MRN [id number]  PCP:  No PCP Per Patient   History of Present Illness: Noah Floyd is a 51 y.o. male with dilated cardiomyopathy, chronic systolic heart failure dx 12/14 with ejection fraction of 20%, possible viral cardiomyopathy, with hospitalization 09/2016 for acute systolic heart failure, shortness of breath became acutely as well as some chest discomfort,, troponin elevation of 7, non-ST elevation myocardial infarction, possible small posterior lateral branch thrombus (2 small for stenting) potentially with otherwise normal coronary arteries, LVEDP 27 mmHg here for follow-up. in the setting of recent respiratory illness here for post hospital care followup.  He is feeling better, no longer with significant shortness of breath. He was homeless at one point. He is looking into Social Security disability.  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  11/01/16-he was admitted with acute systolic heart failure and non-ST elevation myocardial infarction with troponin of 7, possible acute thrombosis of small posterior lateral ventricular branch of the RCA.Marland Kitchen Underwent cardiac catheterization on 10/25/16 by Dr. Tresa Endo. No major vessel coronary artery disease. EF was tender 15% with left ventricular end-diastolic pressure 27 mmHg. Consideration of Entresto. Feels better. Lost weight.   Hypertension was noted during hospitalization. Medications titrated. Creatinine 1.25. LDL 155-statin started.   Wt Readings from Last 3 Encounters:  11/01/16 276 lb (125.2 kg)  10/26/16 278 lb 8 oz (126.3 kg)  04/07/16 (!) 306 lb 9.6 oz (139.1 kg)     Past Medical History:  Diagnosis Date  . Acute systolic heart failure 09/07/2013   EF 20% on 09/08/13. 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 Hypertension 10/25/2016    Past Surgical History:  Procedure  Laterality Date  . CARDIAC CATHETERIZATION N/A 10/25/2016   Procedure: Left Heart Cath and Coronary Angiography;  Surgeon: Lennette Bihari, MD;  Location: [facility] INVASIVE CV LAB;  Service: Cardiovascular;  Laterality: N/A;  . MANDIBLE FRACTURE SURGERY      Current Outpatient Prescriptions  Medication Sig Dispense Refill  . aspirin EC 81 MG EC tablet Take 1 tablet (81 mg total) by mouth daily. 30 tablet 0  . atorvastatin (LIPITOR) 80 MG tablet Take 1 tablet (80 mg total) by mouth daily at 6 PM. 30 tablet 0  . diclofenac sodium (VOLTAREN) 1 % GEL Apply 2 g topically 4 (four) times daily. 100 g 0  . furosemide (LASIX) 40 MG tablet TAKE ONE TABLET BY MOUTH ONCE DAILY 90 tablet 3  . isosorbide-hydrALAZINE (BIDIL) 20-37.5 MG tablet Take 0.5 tablets by mouth 3 (three) times daily. 60 tablet 0  . lisinopril (PRINIVIL,ZESTRIL) 20 MG tablet Take 1 tablet (20 mg total) by mouth daily. 30 tablet 11  . metoprolol succinate (TOPROL-XL) 100 MG 24 hr tablet Take 1 tablet (100 mg total) by mouth 2 (two) times daily. Take with or immediately following a meal. 60 tablet 0  . Potassium Chloride ER 20 MEQ TBCR Take 40 mEq by mouth daily. 60 tablet 11   No current facility-administered medications for this visit.     Allergies:    Allergies  Allergen Reactions  . Ibuprofen     headache    Social History:  The patient  reports that he has never smoked. He has never used smokeless tobacco. He reports that he does not drink alcohol.   ROS:  Please see the history of  present illness.   Denies any syncope, bleeding, orthopnea, PND   PHYSICAL EXAM: VS:  BP (!) 142/84   Pulse (!) 50   Ht 6' (1.829 m)   Wt 276 lb (125.2 kg)   BMI 37.43 kg/m  Well nourished, well developed, in no acute distress  HEENT: normal  Neck: no JVD  Cardiac:  normal S1, S2; RRR; no murmur  Lungs:  clear to auscultation bilaterally, no wheezing, rhonchi or rales  Abd: soft, nontender, no hepatomegaly Overweight Ext: 2+ BLEsignificant  edema  Skin: warm and dry  Neuro: no focal abnormalities noted  EKG:  09/29/14-sinus rhythm, PVC, nonspecific T-wave changes. Heart rate 91. 09/09/13:  Sinus rhythm rate 84, poor R wave progression  ECHO 09/08/13:   - Left ventricle: The cavity size was moderately dilated. Systolic function was severely reduced. The estimated ejection fraction was in the range of 20% to 25%. Diffuse hypokinesis. Features are consistent with a pseudonormal left ventricular filling pattern, with concomitant abnormal relaxation without increased filling pressure (grade 2 diastolic dysfunction). - Left atrium: The atrium was severely dilated. - Right ventricle: The cavity size was mildly dilated. Wall thickness was normal.  Echocardiogram 12/17/13: EF 20-25%-no change  Echocardiogram 10/26/16: - Left ventricle: The cavity size was severely dilated. Wall   thickness was normal. Systolic function was severely reduced. The   estimated ejection fraction was in the range of 25% to 30%.   Diffuse hypokinesis. Features are consistent with a pseudonormal   left ventricular filling pattern, with concomitant abnormal   relaxation and increased filling pressure (grade 2 diastolic   dysfunction). - Mitral valve: There was mild regurgitation. - Left atrium: The atrium was severely dilated.  Impressions:  - Definity used; severe global reduction in LV systoic function   grade 2 diastolic dysfunction; severe LVE; severe LAE; mild MR.  Cath 10/25/16:  There is severe left ventricular systolic dysfunction.  LV end diastolic pressure is moderately elevated.  The left ventricular ejection fraction is less than 25% by visual estimate.   Dilated left ventricle with severe global hypocontractility with an ejection fraction of 10-15%. LVEDP 27 mm Hg.  Normal coronary arteries.  RECOMMENDATION: Medical therapy.  Patient should be titrated on carvedilol, initial ACE-I/ARB with ultimate transition to  ARB/neprolysinlysin inhibition, and aldosterone blockade.  Consider transient LifeVest with ultimate  ICD if LV function does not improve.  TSH 0.74-normal, creatinine 1.28, potassium 4.6  ASSESSMENT AND PLAN:  Chronic systolic heart failure/cardiomyopathy   - Currently stage C, NYHA class II -possible viral or hypertensive. Ejection fraction 20% on 09/08/13 and on repeat echo as well as heart catheterization in 2018 confirming nonischemic cardiomyopathy. Lisinopril and metoprolol were to goal dosage of 20 mg and 200 mg respectively. Encouraged use. During prior hospitalization, blood pressure was quite low at the end of visit. Also, I have instructed him to take an extra Lasix if his weight increases 3-5 pounds. Expressed the importance of medication compliance, salt restriction, fluid restriction. Basic metabolic profile demonstrated creatinine 1.25. Prior to this hospitalization we discussed the need for diagnostic heart catheterization to exclude coronary artery disease. He decided not to proceed. He was worried about insurance. He also told me that he may start utilizing essential oils to help with his blood pressure. I would really like him to stay with his current therapy. He states he will call us if he starts to take them. Discussed the possibility of ventricular arrhythmias and use of ICD. He has not had any syncope during  exercise. No palpitations. He now hopefully will be more compliant with his medication therapy. Sherryll Burger start 24/26. This will hopefully help reduce overall hospitalization as well as heart failure mortality in addition to his current medical therapy. Stop lisinopril for 36 hours prior to administration. - Continue with BiDil as well.  - In 3 months, repeat echocardiogram. If EF remains reduced, I will once again encourage referral to electrophysiology for ICD.  - We also expressed the possibility of utilizing the advanced heart failure clinic given some of his social  limitations, prior homelessness and potential difficulty obtaining medications.  Morbid Obesity -encourage weight loss. He was down to 240 pounds prior to his second marriage. He wants to get back to that position.  -Recent diuresis as help. He is also trying to take control of his diet/exercise once again.  Hypertensive heart disease with heart failure  - Medications reviewed as above.   - Expressed the importance of good blood pressure control as he likely has some component of hypertensive cardiomyopathy.  Hyperlipidemia  - With his recent non-ST elevation myocardial infarction, we would like his LDL less than 70. Continue to encourage atorvastatin use. LDL 155.  2 week followup  Signed, Donato Schultz, MD [facility]  11/01/2016 9:46 AM

## 2016-11-02 ENCOUNTER — Ambulatory Visit: Payer: BLUE CROSS/BLUE SHIELD

## 2016-11-13 ENCOUNTER — Telehealth: Payer: Self-pay

## 2016-11-13 NOTE — Telephone Encounter (Signed)
Entresto denied. Appeal form completed and office notes attached. This was faxed today to [facility].

## 2016-11-21 ENCOUNTER — Ambulatory Visit: Payer: BLUE CROSS/BLUE SHIELD | Admitting: Cardiology

## 2016-11-23 NOTE — Telephone Encounter (Signed)
Appeal denied. Second appeal has been faxed into covermymeds. Awaiting response.

## 2016-11-30 ENCOUNTER — Telehealth: Payer: Self-pay

## 2016-11-30 NOTE — Telephone Encounter (Signed)
**Note De-Identified Martie Fulgham Obfuscation** We received a fax from [facility]. stating that the pts coverage has ended as of 11/23/16. I have left a message for the pt to call me back. We need new insurance info in order to apply for his [place] PA.

## 2016-12-11 ENCOUNTER — Telehealth: Payer: Self-pay | Admitting: Cardiology

## 2016-12-11 NOTE — Telephone Encounter (Signed)
Forwarding to [employer].  11/30/16 TE is blank.

## 2016-12-11 NOTE — Telephone Encounter (Signed)
Follow Up:   Pt have been out of town,returning Noah Floyd Via's phone call from 3--8-18.

## 2016-12-12 ENCOUNTER — Telehealth: Payer: Self-pay | Admitting: *Deleted

## 2016-12-12 NOTE — Telephone Encounter (Signed)
The pt states that he is not taking Entresto at this time because he cannot afford it and he has no insurance. He states that his wife is going through orientation this week at a new job and that as soon as she obtains insurance he plans to start [employer].  Will forward to Dr Anne Fu and his nurse as Lorain Childes.

## 2016-12-12 NOTE — Telephone Encounter (Signed)
-----   Message from Lorelle Formosa Via, LPN sent at 6/00/4599 10:53 AM EDT ----- Lorain Childes

## 2016-12-12 NOTE — Telephone Encounter (Signed)
**Note De-Identified Felicitas Sine Obfuscation** See phone note from 2/19.

## 2016-12-12 NOTE — Telephone Encounter (Signed)
Spoke with pt who states the only medication his is presently taking is Furosemide.  He is unable to afford anything else and at times has to barrow the money to get it.  Reports his wife is starting a job soon and he will have insurance at some point.  He is not sure when that will be.

## 2016-12-15 NOTE — Telephone Encounter (Signed)
Perhaps the folks in advanced heart failure clinic (I.e. Child psychotherapist.Marland Kitchen) can help him with medication. Let's put in a referral to see if they can assist.   Donato Schultz, MD

## 2017-06-14 ENCOUNTER — Encounter: Payer: Self-pay | Admitting: *Deleted

## 2017-06-14 NOTE — Telephone Encounter (Signed)
Pt cancelled last appt and I have not been able to reach him by phone.  Will mail a letter to his home address to call to discuss follow up and who (if anyone) he has been seeing.

## 2017-10-11 ENCOUNTER — Emergency Department (HOSPITAL_COMMUNITY)
Admission: EM | Admit: 2017-10-11 | Discharge: 2017-10-11 | Disposition: A | Discharge status: Home / Self Care | Payer: Self-pay | Attending: Emergency Medicine | Admitting: Emergency Medicine

## 2017-10-11 ENCOUNTER — Encounter (HOSPITAL_COMMUNITY): Payer: Self-pay | Admitting: Emergency Medicine

## 2017-10-11 ENCOUNTER — Emergency Department (HOSPITAL_COMMUNITY): Payer: Self-pay

## 2017-10-11 DIAGNOSIS — I5022 Chronic systolic (congestive) heart failure: Secondary | ICD-10-CM | POA: Insufficient documentation

## 2017-10-11 DIAGNOSIS — I110 Hypertensive heart disease with heart failure: Secondary | ICD-10-CM | POA: Insufficient documentation

## 2017-10-11 DIAGNOSIS — Z7982 Long term (current) use of aspirin: Secondary | ICD-10-CM | POA: Insufficient documentation

## 2017-10-11 DIAGNOSIS — Z79899 Other long term (current) drug therapy: Secondary | ICD-10-CM | POA: Insufficient documentation

## 2017-10-11 DIAGNOSIS — J209 Acute bronchitis, unspecified: Secondary | ICD-10-CM | POA: Insufficient documentation

## 2017-10-11 MED ORDER — AZITHROMYCIN 250 MG PO TABS: 250.0000 mg | ORAL_TABLET | Freq: Every day | ORAL | 0 refills | Status: AC

## 2017-10-11 NOTE — ED Provider Notes (Signed)
[facility]-EMERGENCY DEPT Provider Note   CSN: [id number] Arrival date & time: 10/11/17  0055     History   Chief Complaint Chief Complaint  Patient presents with  . Cough    HPI Noah Floyd is a 52 y.o. male.  The history is provided by the patient. No language interpreter was used.  Cough  This is a new problem. The current episode started more than 1 week ago. The problem occurs constantly. The problem has been gradually worsening. The cough is non-productive. There has been no fever. The fever has been present for 1 to 2 days. Pertinent negatives include no shortness of breath. He has tried nothing for the symptoms. The treatment provided moderate relief. He is not a smoker. His past medical history is significant for pneumonia.  Pt report he ha had pneumonia in the past.  He also has had heart failure.  Pt reports he has been coughing up colored fluid.   Past Medical History:  Diagnosis Date  . Acute systolic heart failure 09/07/2013   EF 20% on 09/08/13. 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 Hypertension 10/25/2016    Patient Active Problem List   Diagnosis Date Noted  . Congestive dilated cardiomyopathy (HCC) 11/01/2016  . Hypertensive heart disease with heart failure (HCC) 11/01/2016  . Hypertension 10/25/2016  . Chest pain 10/25/2016  . Leukopenia 10/25/2016  . Chronic systolic heart failure (HCC) 09/07/2013  . Morbid obesity due to excess calories (HCC) 09/07/2013    Past Surgical History:  Procedure Laterality Date  . CARDIAC CATHETERIZATION N/A 10/25/2016   Procedure: Left Heart Cath and Coronary Angiography;  Surgeon: Lennette Bihari, MD;  Location: [facility] INVASIVE CV LAB;  Service: Cardiovascular;  Laterality: N/A;  . MANDIBLE FRACTURE SURGERY         Home Medications    Prior to Admission medications   Medication Sig Start Date End Date Taking? Authorizing Provider  aspirin EC 81 MG EC tablet Take 1 tablet (81 mg total) by mouth daily.  10/27/16   Hyacinth Meeker, MD  atorvastatin (LIPITOR) 80 MG tablet Take 1 tablet (80 mg total) by mouth daily at 6 PM. 10/26/16   Ahmed, Elyn Peers, MD  diclofenac sodium (VOLTAREN) 1 % GEL Apply 2 g topically 4 (four) times daily. 10/26/16   Hyacinth Meeker, MD  furosemide (LASIX) 40 MG tablet TAKE ONE TABLET BY MOUTH ONCE DAILY 04/07/16   Jake Bathe, MD  isosorbide-hydrALAZINE (BIDIL) 20-37.5 MG tablet Take 0.5 tablets by mouth 3 (three) times daily. 10/26/16   Hyacinth Meeker, MD  metoprolol succinate (TOPROL-XL) 100 MG 24 hr tablet Take 1 tablet (100 mg total) by mouth 2 (two) times daily. Take with or immediately following a meal. 10/26/16   Ahmed, Elyn Peers, MD  Potassium Chloride ER 20 MEQ TBCR Take 40 mEq by mouth daily. 04/07/16   Jake Bathe, MD  sacubitril-valsartan (ENTRESTO) 24-26 MG Take 1 tablet by mouth 2 (two) times daily. 11/01/16   Jake Bathe, MD    Family History Family History  Problem Relation Age of Onset  . Heart disease Mother   . Diabetes Mother   . Heart disease Maternal Grandmother   . Diabetes Maternal Grandmother     Social History Social History   Tobacco Use  . Smoking status: Never Smoker  . Smokeless tobacco: Never Used  Substance Use Topics  . Alcohol use: No  . Drug use: Not on file     Allergies   Ibuprofen  Review of Systems Review of Systems  Respiratory: Positive for cough. Negative for shortness of breath.   All other systems reviewed and are negative.    Physical Exam Updated Vital Signs BP (!) 152/71 (BP Location: Right Arm)   Pulse 87   Temp 99.6 F (37.6 C) (Oral)   Resp 20   Ht 6' (1.829 m)   Wt 114.3 kg (252 lb)   SpO2 100%   BMI 34.18 kg/m   Physical Exam  Constitutional: He is oriented to person, place, and time. He appears well-developed and well-nourished.  HENT:  Head: Normocephalic.  Right Ear: External ear normal.  Left Ear: External ear normal.  Eyes: EOM are normal. Pupils are equal, round, and reactive to light.    Neck: Normal range of motion.  Cardiovascular: Normal rate.  Pulmonary/Chest: Effort normal.  Abdominal: He exhibits no distension.  Musculoskeletal: Normal range of motion.  Neurological: He is alert and oriented to person, place, and time.  Skin: Skin is warm.  Psychiatric: He has a normal mood and affect.  Nursing note and vitals reviewed.    ED Treatments / Results  Labs (all labs ordered are listed, but only abnormal results are displayed) Labs Reviewed - No data to display  EKG  EKG Interpretation None       Radiology Dg Chest 2 View  Result Date: 10/11/2017 CLINICAL DATA:  52 y/o  M; 1 week of productive cough. EXAM: CHEST  2 VIEW COMPARISON:  None. FINDINGS: Mild cardiomegaly. No consolidation, effusion, or pneumothorax identified. Mild multilevel degenerative changes of thoracic spine. No acute osseous abnormality identified. IMPRESSION: Mild cardiomegaly.  No acute pulmonary process identified. Electronically Signed   By: Mitzi Hansen M.D.   On: 10/11/2017 02:07    Procedures Procedures (including critical care time)  Medications Ordered in ED Medications - No data to display   Initial Impression / Assessment and Plan / ED Course  I have reviewed the triage vital signs and the nursing notes.  Pertinent labs & imaging results that were available during my care of the patient were reviewed by me and considered in my medical decision making (see chart for details).     Chest xray no pneumonia   Final Clinical Impressions(s) / ED Diagnoses   Final diagnoses:  Acute bronchitis, unspecified organism    ED Discharge Orders        Ordered    azithromycin (ZITHROMAX) 250 MG tablet  Daily     10/11/17 0656    An After Visit Summary was printed and given to the patient.    Elson Areas, [place] 10/11/17 1610    Palumbo, April, MD 10/11/17 [phone]

## 2017-10-11 NOTE — Discharge Instructions (Signed)
Take your blood pressure medications as directed.

## 2017-10-11 NOTE — ED Triage Notes (Signed)
Pt comes from shelter via EMS with complaints of productive cough for the past few days.  Pt sounds congested in triage.  Ambulatory and A&O x4. Lung sounds clear.  Endorse sputum as thick and green. Vitals stable in route. 99.9 temp in route. Endorses chest wall pain upon coughing.

## 2018-02-09 IMAGING — DX DG CHEST 2V
2 series · 2 of 2 positions shown · non-contrast
Comparison: 09/09/2013 .

CLINICAL DATA: Chest pain .

EXAM:
CHEST  2 VIEW

[w chest pa]
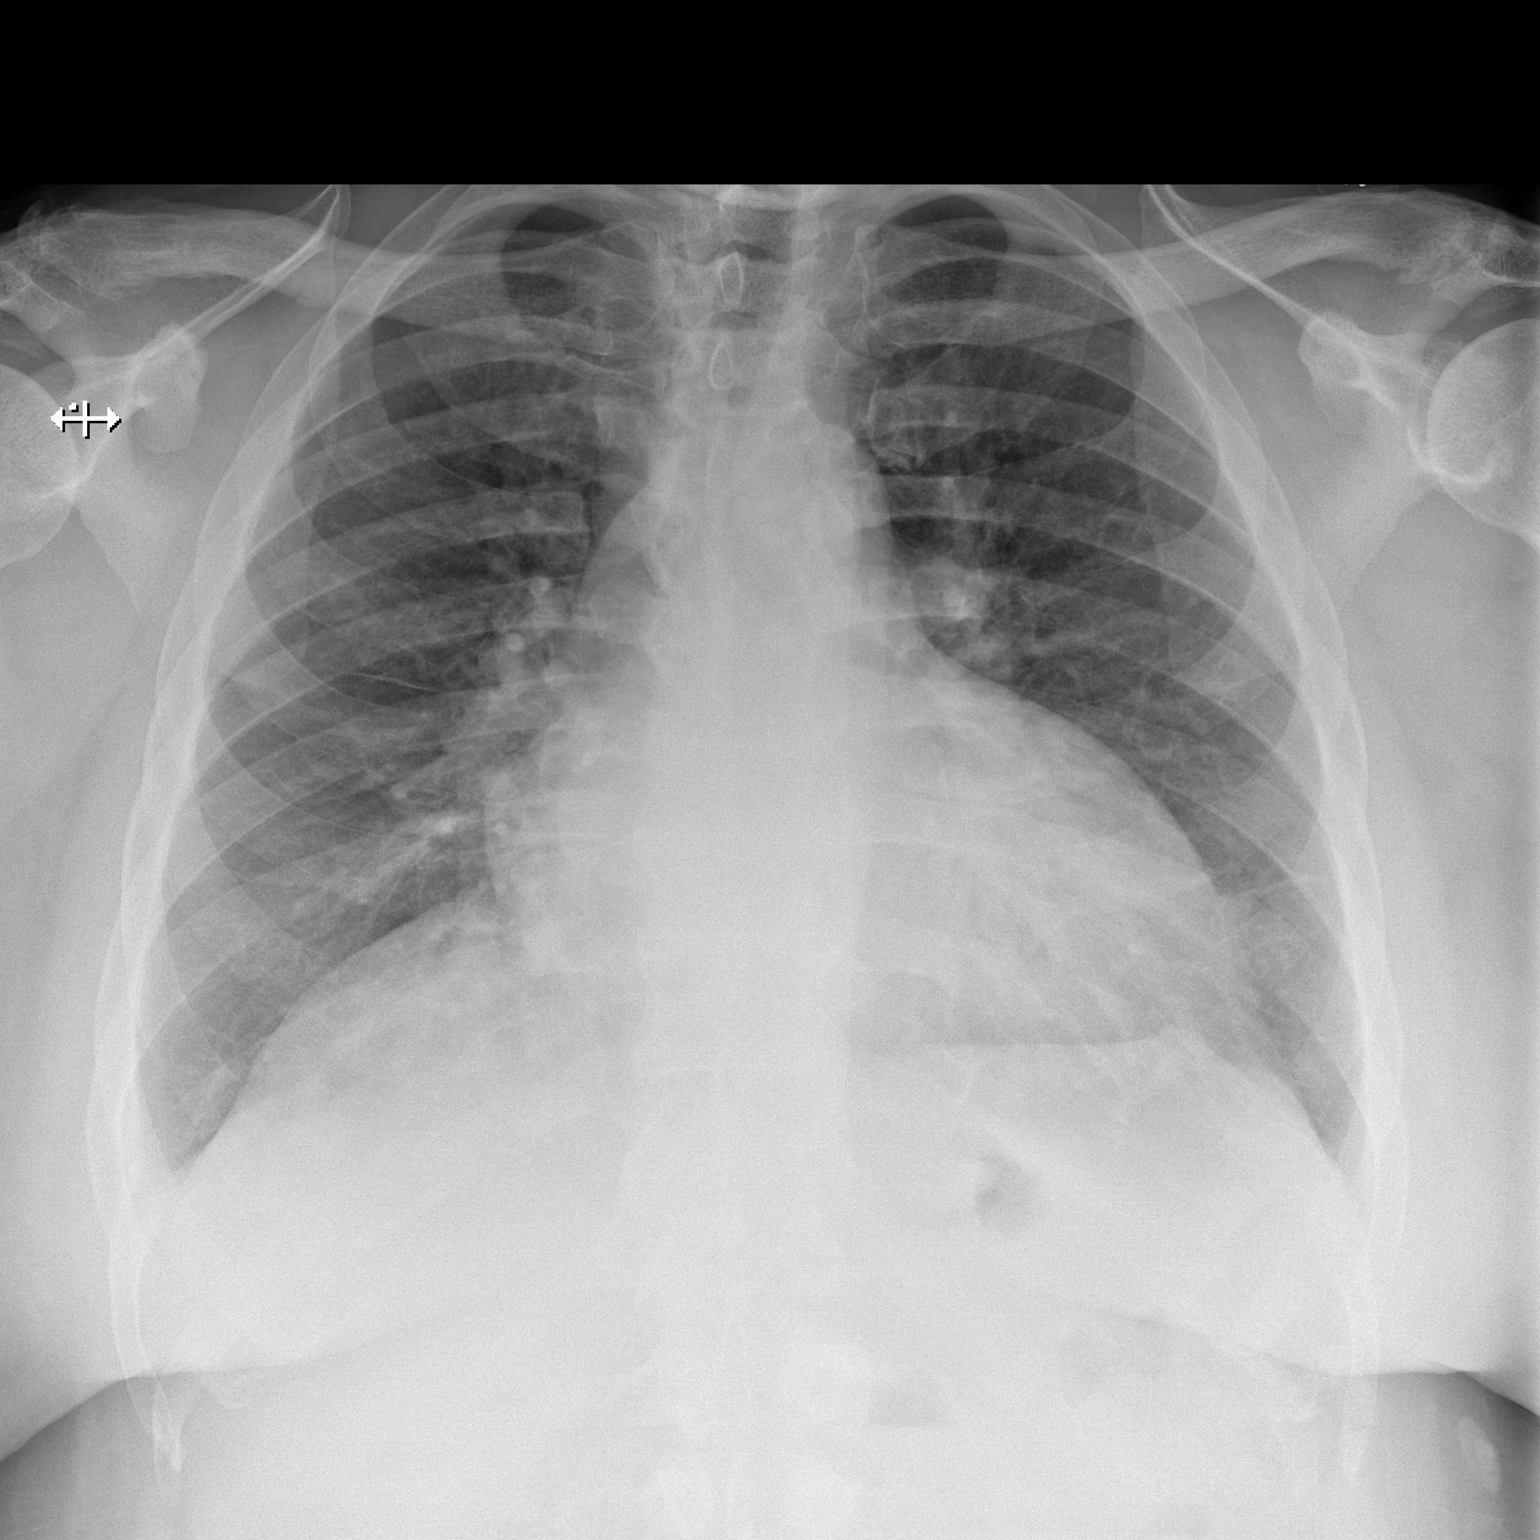

[w chest lat]
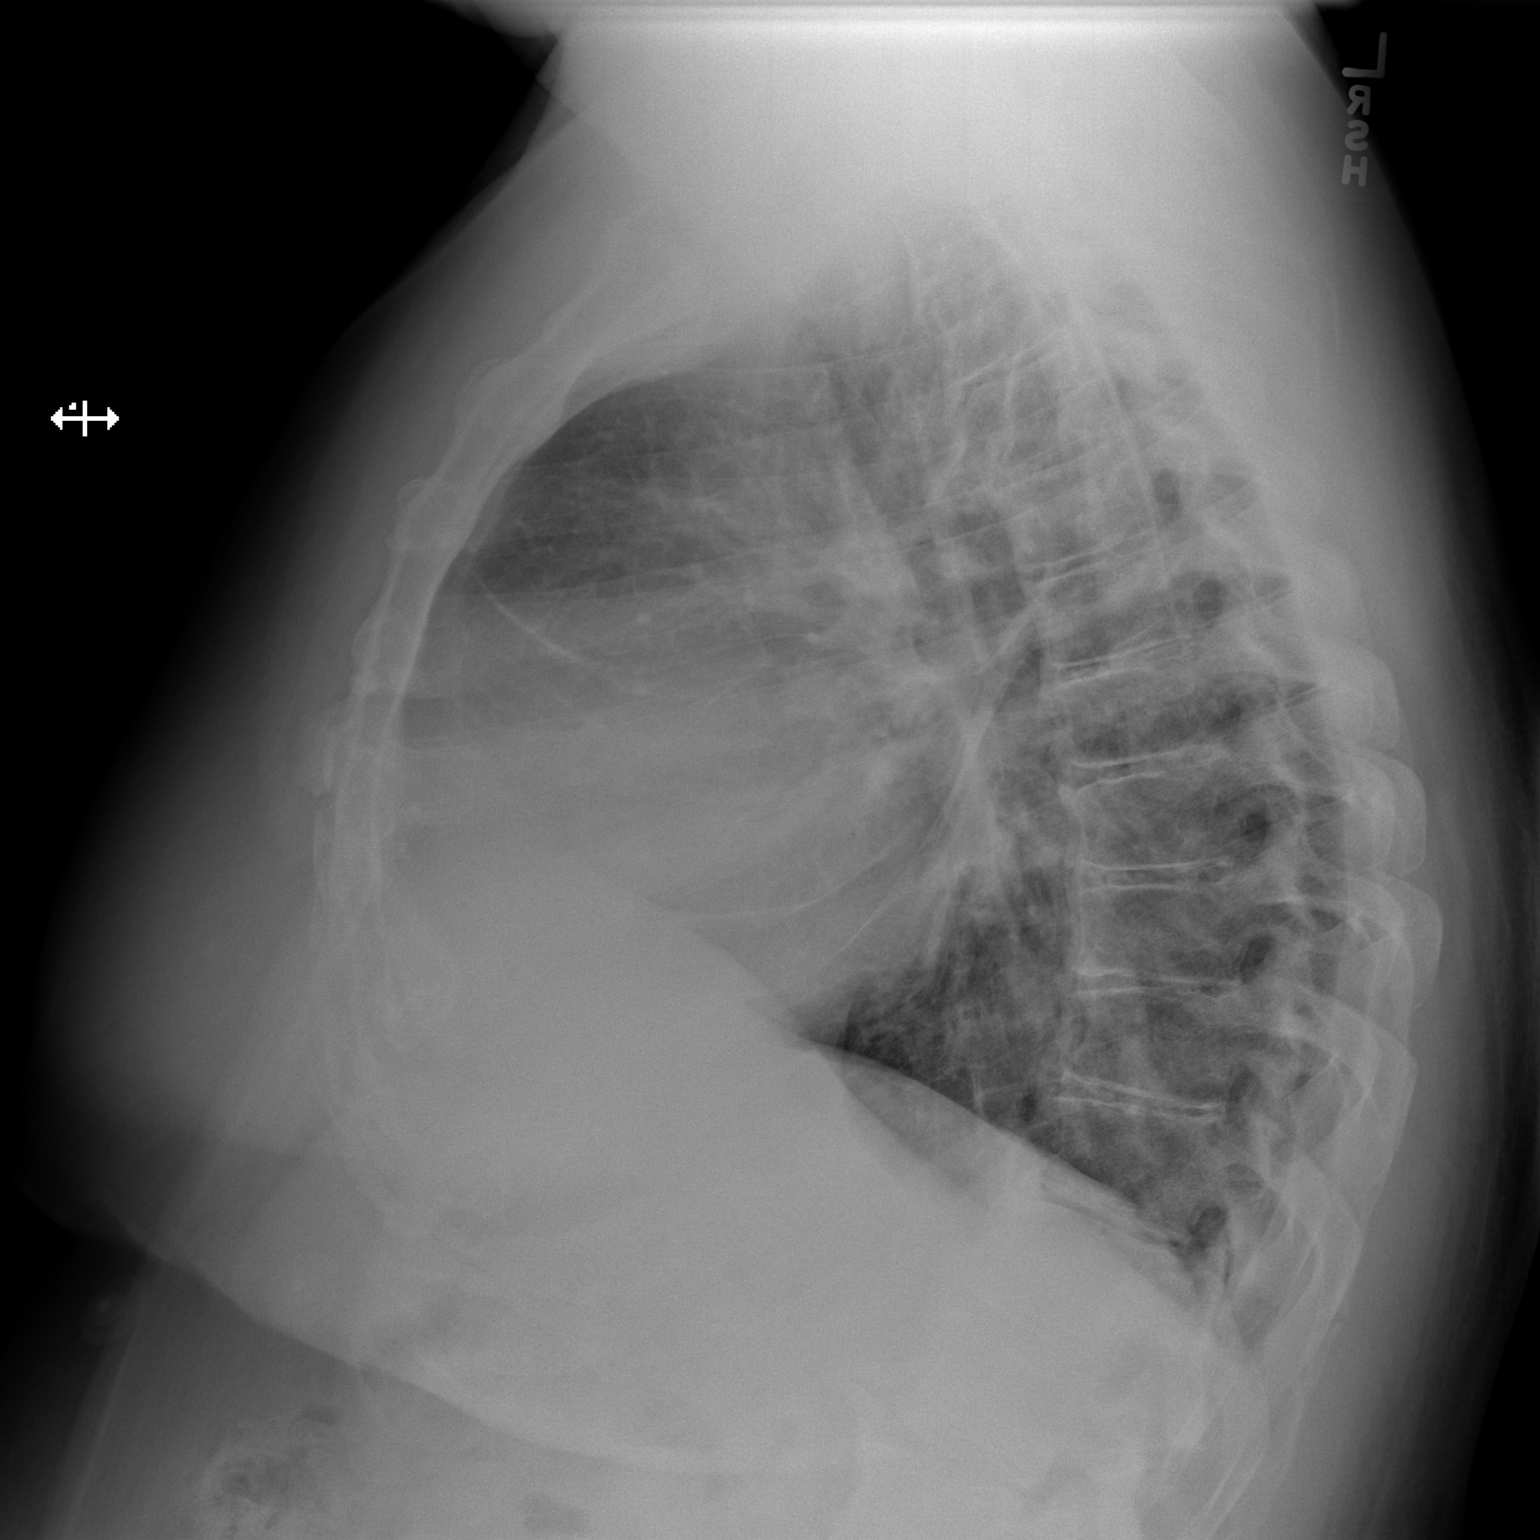

[2 of 2 positions shown; findings below may reference images not displayed]

FINDINGS: Mediastinum and hilar structures normal. Prominent cardiomegaly with
mild pulmonary vascular prominence. No focal infiltrate. Low lung
volumes. Small right pleural effusion. No pneumothorax .
IMPRESSION: 1.  Prominent cardiomegaly with mild pulmonary vascular prominence.

2. Low lung volumes. No focal infiltrate. Small right pleural
effusion.

## 2018-02-13 ENCOUNTER — Telehealth: Payer: Self-pay

## 2018-02-13 NOTE — Congregational Nurse Program (Signed)
Congregational Nurse Program Note  Date of Encounter: 02/12/2018  Past Medical History: Past Medical History:  Diagnosis Date  . Acute systolic heart failure 09/07/2013   EF 20% on 09/08/13. 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 Hypertension 10/25/2016    Encounter Details: CNP Questionnaire - 02/13/18 1749      Questionnaire   Patient Status  Not Applicable    Race  Black or African American    Location Patient Served At  Not Applicable    Insurance  Not Applicable    Uninsured  Uninsured (NEW 1x/quarter)    Food  No food insecurities    Housing/Utilities  No permanent housing    Transportation  No transportation needs    Interpersonal Safety  Yes, feel physically and emotionally safe where you currently live    Medication  No medication insecurities    Medical Provider  No    Referrals  Primary Care Provider/Clinic    ED Visit Averted  Not Applicable    Life-Saving Intervention Made  Not Applicable      Initial visit for client . States he moved from [place] after 15 years ,no family here was an only child, x-wife and children live in [place] . 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  Was at [facility] and was seen at  [facility] clinic but now needs a PCP here ,Has received his orange card but has no appointment for care. Will need his medications from [facility] and and refill on his  Water medication  Furosemide at the end of June and doesn't want to run out. Client was to call [place] [facility] and get his address changed and let the nurse know if it is a problem ,will assist him with it if need be. Referral to [facility] to get client established with a PCP. States he took goody powder for headaches almost  12 years and feel it may have affected his hearing some ,will discuss with his new PCP once his appointment is made. He has a slow heart rate and has had that problem for some time states he had a CT scan of his heart ,no  reason for such  slow rate ,no SOB or other symptoms .Has lost a lot of weight over the years ,he knew he had to for his health. Plan get his address changed with [facility]  Need appointment for PCP in order to get other medications and have a point of care person. Return tomorrow for follow up

## 2018-02-13 NOTE — Congregational Nurse Program (Signed)
Congregational Nurse Program Note  Date of Encounter: 02/13/2018  Past Medical History: Past Medical History:  Diagnosis Date  . Acute systolic heart failure 09/07/2013   EF 20% on 09/08/13. 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 Hypertension 10/25/2016    Encounter Details: CNP Questionnaire - 02/13/18 1732      Questionnaire   Patient Status  Not Applicable    Race  Black or African American    Location Patient Served At  Not Applicable    Insurance  Not Applicable    Uninsured  Uninsured (NEW 1x/quarter)    Food  No food insecurities    Housing/Utilities  No permanent housing    Transportation  No transportation needs    Interpersonal Safety  Yes, feel physically and emotionally safe where you currently live    Medication  Provided medication assistance;No medication insecurities    Medical Provider  No    Referrals  Primary Care Provider/Clinic    ED Visit Averted  Not Applicable    Life-Saving Intervention Made  Not Applicable      Second visit  For this client whom needs to get an appointment with a PCP.Tc to [facility] where he has received his orange card to see if we can get him scheduled ,needs to complete application ,he states he completed but was given information again. Client will complete and stop by to get an appointment scheduled  In order to get  a PCP in [facility] and get his prescription for his Furosemide 40 mg at the end of June. Tc to  [facility] to get address changed so med's will come to this address was completed . Client to call in 03/20/18  For medication refills on his  Carvedilol 6.25 mg , Aspirin  EC  81 mg, Lisinopril 2.5 mg and  Atorvastatin  40 mg Today he talked about his divorce ,his children and x wife that live in [place] .Children are ages 59,28 Very pleasant man that  Wants  to get  his medical care in order and take reasonability for his care. Client will follow plan to complete application for [facility] and return to check in with nurse next week.

## 2018-02-13 NOTE — Telephone Encounter (Signed)
Client will ned to complete application for services at  [facility], nurse gave client a packet to complete ,stop by with ID and orange card to get an appointment. Client states he will stop by on Thursday or Friday  Will check in with nurse next week

## 2018-02-13 NOTE — Telephone Encounter (Signed)
Client 's address changed with [facility] program .will call in for his refills  03/20/18

## 2018-11-24 DEATH — deceased
